# Patient Record
Sex: Female | Born: 1965 | Hispanic: Yes | Marital: Married | Smoking: Never smoker
Health system: Southern US, Community
[De-identification: ages and names within clinical notes are randomized; demographics above are authoritative.]

## PROBLEM LIST (undated history)

## (undated) DIAGNOSIS — Z9889 Other specified postprocedural states: Secondary | ICD-10-CM

## (undated) DIAGNOSIS — D649 Anemia, unspecified: Secondary | ICD-10-CM

## (undated) DIAGNOSIS — T8859XA Other complications of anesthesia, initial encounter: Secondary | ICD-10-CM

## (undated) DIAGNOSIS — T7840XA Allergy, unspecified, initial encounter: Secondary | ICD-10-CM

## (undated) DIAGNOSIS — K8020 Calculus of gallbladder without cholecystitis without obstruction: Secondary | ICD-10-CM

## (undated) DIAGNOSIS — K219 Gastro-esophageal reflux disease without esophagitis: Secondary | ICD-10-CM

## (undated) DIAGNOSIS — J45909 Unspecified asthma, uncomplicated: Secondary | ICD-10-CM

## (undated) HISTORY — PX: OTHER SURGICAL HISTORY: SHX169

## (undated) HISTORY — DX: Allergy, unspecified, initial encounter: T78.40XA

## (undated) HISTORY — PX: CARPAL TUNNEL RELEASE: SHX101

## (undated) HISTORY — PX: DIAGNOSTIC LAPAROSCOPY: SUR761

---

## 2003-12-17 ENCOUNTER — Ambulatory Visit: Payer: Self-pay | Admitting: Obstetrics and Gynecology

## 2004-01-01 ENCOUNTER — Ambulatory Visit: Payer: Self-pay | Admitting: Obstetrics and Gynecology

## 2004-07-11 ENCOUNTER — Ambulatory Visit: Payer: Self-pay | Admitting: Obstetrics and Gynecology

## 2004-12-05 ENCOUNTER — Ambulatory Visit: Payer: Self-pay | Admitting: Obstetrics and Gynecology

## 2004-12-15 ENCOUNTER — Ambulatory Visit: Payer: Self-pay | Admitting: Obstetrics and Gynecology

## 2005-07-17 ENCOUNTER — Ambulatory Visit: Payer: Self-pay | Admitting: General Surgery

## 2006-02-20 ENCOUNTER — Ambulatory Visit: Payer: Self-pay | Admitting: Obstetrics and Gynecology

## 2006-03-22 ENCOUNTER — Emergency Department: Payer: Self-pay | Admitting: General Practice

## 2006-03-29 ENCOUNTER — Ambulatory Visit: Payer: Self-pay | Admitting: Obstetrics and Gynecology

## 2006-03-30 ENCOUNTER — Ambulatory Visit: Payer: Self-pay | Admitting: Obstetrics and Gynecology

## 2006-04-01 ENCOUNTER — Other Ambulatory Visit: Payer: Self-pay

## 2006-04-02 ENCOUNTER — Observation Stay: Payer: Self-pay | Admitting: Obstetrics and Gynecology

## 2007-09-08 ENCOUNTER — Other Ambulatory Visit: Payer: Self-pay

## 2007-09-08 ENCOUNTER — Emergency Department: Payer: Self-pay | Admitting: Emergency Medicine

## 2007-09-21 ENCOUNTER — Other Ambulatory Visit: Payer: Self-pay

## 2007-09-21 ENCOUNTER — Emergency Department: Payer: Self-pay | Admitting: Emergency Medicine

## 2007-10-30 ENCOUNTER — Ambulatory Visit: Payer: Self-pay

## 2008-11-04 ENCOUNTER — Ambulatory Visit: Payer: Self-pay | Admitting: Family Medicine

## 2009-12-29 ENCOUNTER — Ambulatory Visit: Payer: Self-pay | Admitting: Family Medicine

## 2011-04-19 ENCOUNTER — Ambulatory Visit: Payer: Self-pay

## 2012-11-25 ENCOUNTER — Ambulatory Visit: Payer: Self-pay

## 2014-04-29 ENCOUNTER — Ambulatory Visit: Payer: Self-pay

## 2015-03-17 ENCOUNTER — Other Ambulatory Visit: Payer: Self-pay | Admitting: Unknown Physician Specialty

## 2015-03-17 DIAGNOSIS — G8929 Other chronic pain: Secondary | ICD-10-CM

## 2015-03-17 DIAGNOSIS — R1011 Right upper quadrant pain: Secondary | ICD-10-CM

## 2015-03-17 DIAGNOSIS — R1013 Epigastric pain: Principal | ICD-10-CM

## 2015-03-19 ENCOUNTER — Ambulatory Visit
Admission: RE | Admit: 2015-03-19 | Discharge: 2015-03-19 | Disposition: A | Discharge status: Home / Self Care | Payer: BLUE CROSS/BLUE SHIELD | Source: Ambulatory Visit | Attending: Unknown Physician Specialty | Admitting: Unknown Physician Specialty

## 2015-03-19 DIAGNOSIS — G8929 Other chronic pain: Secondary | ICD-10-CM | POA: Diagnosis present

## 2015-03-19 DIAGNOSIS — R1011 Right upper quadrant pain: Secondary | ICD-10-CM

## 2015-03-19 DIAGNOSIS — K8020 Calculus of gallbladder without cholecystitis without obstruction: Secondary | ICD-10-CM | POA: Insufficient documentation

## 2015-03-19 DIAGNOSIS — R1013 Epigastric pain: Secondary | ICD-10-CM

## 2015-03-24 ENCOUNTER — Other Ambulatory Visit: Payer: Self-pay | Admitting: Surgery

## 2015-03-24 DIAGNOSIS — K8020 Calculus of gallbladder without cholecystitis without obstruction: Secondary | ICD-10-CM

## 2015-03-26 ENCOUNTER — Ambulatory Visit
Admission: RE | Admit: 2015-03-26 | Discharge: 2015-03-26 | Disposition: A | Discharge status: Home / Self Care | Payer: BLUE CROSS/BLUE SHIELD | Source: Ambulatory Visit | Attending: Surgery | Admitting: Surgery

## 2015-03-26 DIAGNOSIS — K8010 Calculus of gallbladder with chronic cholecystitis without obstruction: Secondary | ICD-10-CM | POA: Diagnosis present

## 2015-03-26 DIAGNOSIS — K824 Cholesterolosis of gallbladder: Secondary | ICD-10-CM | POA: Diagnosis not present

## 2015-03-26 DIAGNOSIS — K8020 Calculus of gallbladder without cholecystitis without obstruction: Secondary | ICD-10-CM

## 2015-03-28 ENCOUNTER — Encounter: Payer: Self-pay | Admitting: Emergency Medicine

## 2015-03-28 DIAGNOSIS — N938 Other specified abnormal uterine and vaginal bleeding: Secondary | ICD-10-CM | POA: Insufficient documentation

## 2015-03-28 DIAGNOSIS — Z3202 Encounter for pregnancy test, result negative: Secondary | ICD-10-CM | POA: Diagnosis not present

## 2015-03-28 DIAGNOSIS — N939 Abnormal uterine and vaginal bleeding, unspecified: Secondary | ICD-10-CM | POA: Diagnosis present

## 2015-03-28 DIAGNOSIS — R531 Weakness: Secondary | ICD-10-CM | POA: Insufficient documentation

## 2015-03-28 LAB — POCT PREGNANCY, URINE: PREG TEST UR: NEGATIVE

## 2015-03-28 NOTE — ED Notes (Signed)
Vaginal bleeding since yesterday - on birth control and it is not time. Believes may be because of her newly dx gallstones.

## 2015-03-28 NOTE — ED Notes (Signed)
On birth control and started bleeding yesterday. Is worried that it is because she has gall stones. Also states worried she will get dizzy

## 2015-03-29 ENCOUNTER — Emergency Department
Admission: EM | Admit: 2015-03-29 | Discharge: 2015-03-29 | Disposition: A | Discharge status: Home / Self Care | Payer: BLUE CROSS/BLUE SHIELD | Attending: Emergency Medicine | Admitting: Emergency Medicine

## 2015-03-29 ENCOUNTER — Emergency Department: Payer: BLUE CROSS/BLUE SHIELD

## 2015-03-29 DIAGNOSIS — N938 Other specified abnormal uterine and vaginal bleeding: Secondary | ICD-10-CM

## 2015-03-29 DIAGNOSIS — N939 Abnormal uterine and vaginal bleeding, unspecified: Secondary | ICD-10-CM

## 2015-03-29 HISTORY — DX: Calculus of gallbladder without cholecystitis without obstruction: K80.20

## 2015-03-29 LAB — COMPREHENSIVE METABOLIC PANEL
ALT: 83 U/L — ABNORMAL HIGH (ref 14–54)
ANION GAP: 10 (ref 5–15)
AST: 27 U/L (ref 15–41)
Albumin: 4.8 g/dL (ref 3.5–5.0)
Alkaline Phosphatase: 112 U/L (ref 38–126)
BILIRUBIN TOTAL: 0.9 mg/dL (ref 0.3–1.2)
BUN: 8 mg/dL (ref 6–20)
CO2: 26 mmol/L (ref 22–32)
Calcium: 9.8 mg/dL (ref 8.9–10.3)
Chloride: 104 mmol/L (ref 101–111)
Creatinine, Ser: 0.67 mg/dL (ref 0.44–1.00)
Glucose, Bld: 97 mg/dL (ref 65–99)
POTASSIUM: 3.4 mmol/L — AB (ref 3.5–5.1)
Sodium: 140 mmol/L (ref 135–145)
TOTAL PROTEIN: 8.3 g/dL — AB (ref 6.5–8.1)

## 2015-03-29 LAB — URINALYSIS COMPLETE WITH MICROSCOPIC (ARMC ONLY)
BILIRUBIN URINE: NEGATIVE
GLUCOSE, UA: NEGATIVE mg/dL
Leukocytes, UA: NEGATIVE
NITRITE: NEGATIVE
Protein, ur: NEGATIVE mg/dL
Specific Gravity, Urine: 1.004 — ABNORMAL LOW (ref 1.005–1.030)
pH: 7 (ref 5.0–8.0)

## 2015-03-29 LAB — CBC
HEMATOCRIT: 36.9 % (ref 35.0–47.0)
HEMOGLOBIN: 12.5 g/dL (ref 12.0–16.0)
MCH: 30.3 pg (ref 26.0–34.0)
MCHC: 33.9 g/dL (ref 32.0–36.0)
MCV: 89.5 fL (ref 80.0–100.0)
Platelets: 193 10*3/uL (ref 150–440)
RBC: 4.12 MIL/uL (ref 3.80–5.20)
RDW: 13.3 % (ref 11.5–14.5)
WBC: 6.7 10*3/uL (ref 3.6–11.0)

## 2015-03-29 LAB — CHLAMYDIA/NGC RT PCR (ARMC ONLY)
CHLAMYDIA TR: NOT DETECTED
N gonorrhoeae: NOT DETECTED

## 2015-03-29 LAB — LIPASE, BLOOD: LIPASE: 28 U/L (ref 11–51)

## 2015-03-29 LAB — WET PREP, GENITAL
CLUE CELLS WET PREP: NONE SEEN
Sperm: NONE SEEN
Trich, Wet Prep: NONE SEEN
Yeast Wet Prep HPF POC: NONE SEEN

## 2015-03-29 MED ORDER — SODIUM CHLORIDE 0.9 % IV BOLUS (SEPSIS)
1000.0000 mL | Freq: Once | INTRAVENOUS | Status: AC
  Administered 2015-03-29: 1000 mL via INTRAVENOUS

## 2015-03-29 NOTE — ED Provider Notes (Signed)
[facility] Emergency Department Provider Note  ____________________________________________  Time seen: Approximately 131 AM  I have reviewed the triage vital signs and the nursing notes.   HISTORY  Chief Complaint Vaginal Bleeding    HPI Monique Sanders is a 50 y.o. female who comes into the hospital today with bleeding she reports either in her urine or her vagina and feeling dizzy. The patient reports that she was here 1 week ago and had an ultrasound of her gallbladder as well as another test. She reports that she was found to have gallstones. She reports that she is been getting weak and pale from the pain. She was told that she needed surgery and she is here because she wants surgery done. The patient reports that she's having pressure in her upper abdomen and not pain. She reports that she has some flank pain. She has not been eating normal and she has been told to go on a bland diet. The patient is also feeling weak. The patient's family reports that they were scared because the bleeding started and they didn't know if it was due to the gallstones. The patient is on birth control pills and reports that she is not yet due to have her menstrual cycle. The patient's family wants her to have both problems taking care of because of her symptoms. The family reports the patient did have some black stool a few days ago after drinking some lemon juice and in attempt to dissolve her gallstones. She has not had black stool since then.The patient and her family are concerned so they decided to come in for evaluation.   Past Medical History  Diagnosis Date  . Gallstones     There are no active problems to display for this patient.   No past surgical history on file.  No current outpatient prescriptions on file.  Allergies Codeine; Pamabrom; Pamprin; and Tylenol  No family history on file.  Social History Social History  Substance Use Topics  . Smoking  status: Not on file  . Smokeless tobacco: Not on file  . Alcohol Use: Not on file    Review of Systems Constitutional: No fever/chills Eyes: No visual changes. ENT: No sore throat. Cardiovascular: Denies chest pain. Respiratory: Denies shortness of breath. Gastrointestinal: Abdominal pain Genitourinary: Vaginal bleeding Musculoskeletal: Right flank pain Skin: Negative for rash. Neurological: Generalized weakness  10-point ROS otherwise negative.  ____________________________________________   PHYSICAL EXAM:  VITAL SIGNS: ED Triage Vitals  Enc Vitals Group     BP 03/28/15 2120 141/73 mmHg     Pulse Rate 03/28/15 2120 93     Resp 03/28/15 2120 18     Temp 03/28/15 2120 97.9 F (36.6 C)     Temp Source 03/28/15 2120 Oral     SpO2 03/28/15 2120 98 %     Weight 03/28/15 2120 130 lb (58.968 kg)     Height 03/28/15 2120  (1.575 m)     Head Cir --      Peak Flow --      Pain Score 03/29/15 0208 0     Pain Loc --      Pain Edu? --      Excl. in GC? --     Constitutional: Alert and oriented. Well appearing and in mild distress. Eyes: Conjunctivae are normal. PERRL. EOMI. Head: Atraumatic. Nose: No congestion/rhinnorhea. Mouth/Throat: Mucous membranes are moist.  Oropharynx non-erythematous. Cardiovascular: Normal rate, regular rhythm. Grossly normal heart sounds.  Good peripheral circulation. Respiratory:  Normal respiratory effort.  No retractions. Lungs CTAB. Gastrointestinal: Soft and nontender. No distention. Positive bowel sounds Genitourinary: Normal external genitalia with some moderate blood in the vaginal vault. Cervix unremarkable with right adnexal tenderness to palpation Musculoskeletal: No lower extremity tenderness nor edema.   Neurologic:  Normal speech and language.  Skin:  Skin is warm, dry and intact.  Psychiatric: Mood and affect are normal.   ____________________________________________   LABS (all labs ordered are listed, but only abnormal  results are displayed)  Labs Reviewed  WET PREP, GENITAL - Abnormal; Notable for the following:    WBC, Wet Prep HPF POC MODERATE (*)    All other components within normal limits  URINALYSIS COMPLETEWITH MICROSCOPIC ([facility] ONLY) - Abnormal; Notable for the following:    Color, Urine YELLOW (*)    APPearance CLEAR (*)    Ketones, ur TRACE (*)    Specific Gravity, Urine 1.004 (*)    Hgb urine dipstick 3+ (*)    Bacteria, UA RARE (*)    Squamous Epithelial / LPF 0-5 (*)    All other components within normal limits  COMPREHENSIVE METABOLIC PANEL - Abnormal; Notable for the following:    Potassium 3.4 (*)    Total Protein 8.3 (*)    ALT 83 (*)    All other components within normal limits  CHLAMYDIA/NGC RT PCR ([facility] ONLY)  CBC  LIPASE, BLOOD  POC URINE PREG, ED  POCT PREGNANCY, URINE   ____________________________________________  EKG  None ____________________________________________  RADIOLOGY  US pelvis: Normal appearance of the uterus and ovaries, Normal endometrial stripe thickness ____________________________________________   PROCEDURES  Procedure(s) performed: None  Critical Care performed: No  ____________________________________________   INITIAL IMPRESSION / ASSESSMENT AND PLAN / ED COURSE  Pertinent labs & imaging results that were available during my care of the patient were reviewed by me and considered in my medical decision making (see chart for details).  This is a 50 year old female who comes in today with vaginal bleeding as well as feeling dizzy. The patient does have gallstones and is concerned this may be due to her gallstones. I will send the patient for an ultrasound of her pelvis looking for causes of her bleeding and I will check some blood work as well.  The patient's ultrasound is unremarkable as well as blood work. The patient does not have any anemia at this time nor is she having any pain. Although the patient's husband would  like for [place] to contact surgery to take her gallbladder the patient does not have an emergent need to have her gallbladder removed as she is not having any pain. The patient will be discharged home to follow-up with surgery as well as with OB/GYN. As the patient is taking birth control pills I will hold off on dosing Provera and have her follow-up. ____________________________________________   FINAL CLINICAL IMPRESSION(S) / ED DIAGNOSES  Final diagnoses:  Vaginal bleeding  Dysfunctional uterine bleeding      Rebecka Apley, MD 03/29/15 [phone]

## 2015-03-29 NOTE — Discharge Instructions (Signed)
Metrorragia funcional (Dysfunctional Uterine Bleeding) La metrorragia funcional es una hemorragia anormal proveniente del tero. La metrorragia funcional incluye estos sntomas:  Menstruacin que se adelanta o se atrasa.  Menstruacin menos o ms abundante, o con cogulos sanguneos.  Hemorragias entre los perodos menstruales.  Ausencia de una o ms menstruaciones.  Hemorragias luego de mantener relaciones sexuales.  Sangrado luego de la menopausia. INSTRUCCIONES PARA EL CUIDADO EN EL HOGAR  Est atenta a cualquier cambio en los sntomas. Estas indicaciones pueden ayudarla con el trastorno: Comidas  Siga una dieta equilibrada. Incluya alimentos con alto contenido de hierro, como hgado, carne, mariscos, verduras de hoja verde y huevos.  Si tiene estreimiento:  Beba abundante agua.  Consuma frutas y verduras con alto contenido de agua y fibra, como espinaca, zanahorias, frambuesas, manzanas y mango. Medicamentos  Tome los medicamentos de venta libre y los recetados solamente como se lo haya indicado el mdico.  No haga cambios en los medicamentos sin hablar con el mdico.  La aspirina o los medicamentos que la contienen pueden aumentar la hemorragia. No tome esos medicamentos:  Durante la semana previa a la menstruacin.  Durante la menstruacin.  Si le recetaron comprimidos de hierro, tmelos como se lo haya indicado el mdico. Estos ayudan a reponer el hierro que el organismo pierde debido a este trastorno. Actividad  Si debe cambiarse el apsito o el tampn ms de una vez cada 2horas:  Acustese con los pies elevados.  Colquese una compresa fra en la parte baja del abdomen.  Haga todo el reposo que pueda hasta que la hemorragia se detenga o disminuya.  No trate de adelgazar hasta que la hemorragia se detenga y los niveles de hierro en la sangre se normalicen. Otras indicaciones  Durante dos meses, anote lo siguiente:  La fecha de comienzo de la  menstruacin.  La fecha de su finalizacin.  Los momentos en los que tiene una hemorragia anormal.  Los problemas que advierte.  Concurra a todas las visitas de control como se lo haya indicado el mdico. Esto es importante. SOLICITE ATENCIN MDICA SI:  Se siente dbil o que va a desvanecerse.  Tiene nuseas y vmitos.  No puede comer ni beber sin vomitar.  Tiene mareos o diarrea mientras toma los medicamentos.  Est tomando anticonceptivos u hormonas, y desea cambiar o suspender estos medicamentos. SOLICITE ATENCIN MDICA DE INMEDIATO SI:  Tiene escalofros o fiebre.  Debe cambiarse el apsito o el tampn ms de una vez por hora.  La hemorragia se vuelve ms abundante o el flujo menstrual contiene cogulos con ms frecuencia.  Siente dolor en el abdomen.  Pierde la conciencia.  Le aparece una erupcin cutnea.   Esta informacin no tiene como fin reemplazar el consejo del mdico. Asegrese de hacerle al mdico cualquier pregunta que tenga.   Document Released: 11/09/2004 Document Revised: 10/21/2014 Elsevier Interactive Patient Education 2016 [employer] Inc.  

## 2015-03-30 ENCOUNTER — Inpatient Hospital Stay: Admission: RE | Admit: 2015-03-30 | Payer: BLUE CROSS/BLUE SHIELD | Source: Ambulatory Visit

## 2015-03-31 ENCOUNTER — Encounter: Payer: Self-pay | Admitting: *Deleted

## 2015-03-31 NOTE — Patient Instructions (Addendum)
  Your procedure is scheduled on: 04/06/15 Report to Day Surgery. [facility] SECOND FLOOR To find out your arrival time please call [phone] between 1PM - 3PM on 04/05/15  Remember: Instructions that are not followed completely may result in serious medical risk, up to and including death, or upon the discretion of your surgeon and anesthesiologist your surgery may need to be rescheduled.    _X___ 1. Do not eat food or drink liquids after midnight. No gum chewing or hard candies.     _X___ 2. No Alcohol for 24 hours before or after surgery.   ____ 3. Bring all medications with you on the day of surgery if instructed.    _X___ 4. Notify your doctor if there is any change in your medical condition     (cold, fever, infections).     Do not wear jewelry, make-up, hairpins, clips or nail polish.  Do not wear lotions, powders, or perfumes. You may wear deodorant.  Do not shave 48 hours prior to surgery. Men may shave face and neck.  Do not bring valuables to the hospital.    [facility] is not responsible for any belongings or valuables.               Contacts, dentures or bridgework may not be worn into surgery.  Leave your suitcase in the car. After surgery it may be brought to your room.  For patients admitted to the hospital, discharge time is determined by your                treatment team.   Patients discharged the day of surgery will not be allowed to drive home.   Please read over the following fact sheets that you were given:   Surgical Site Infection Prevention   ____ Take these medicines the morning of surgery with A SIP OF WATER:    1. NONE  2.   3.   4.  5.  6.  ____ Fleet Enema (as directed)   __X__ Use CHG Soap as directed  _X___ Use inhalers on the day of surgery  ____ Stop metformin 2 days prior to surgery    ____ Take 1/2 of usual insulin dose the night before surgery and none on the morning of surgery.   ____ Stop Coumadin/Plavix/aspirin on    __X__ Stop Anti-inflammatories on   NO MORE IBUPROFEN UNTIL AFTER SURGERY   ____ Stop supplements until after surgery.    ____ Bring C-Pap to the hospital.

## 2015-04-06 ENCOUNTER — Ambulatory Visit: Admission: RE | Admit: 2015-04-06 | Payer: BLUE CROSS/BLUE SHIELD | Source: Ambulatory Visit | Admitting: Surgery

## 2015-04-06 HISTORY — DX: Gastro-esophageal reflux disease without esophagitis: K21.9

## 2015-04-06 SURGERY — LAPAROSCOPIC CHOLECYSTECTOMY WITH INTRAOPERATIVE CHOLANGIOGRAM
Anesthesia: Choice

## 2015-04-08 ENCOUNTER — Encounter: Payer: Self-pay | Admitting: Gastroenterology

## 2015-04-08 ENCOUNTER — Ambulatory Visit (INDEPENDENT_AMBULATORY_CARE_PROVIDER_SITE_OTHER): Payer: BLUE CROSS/BLUE SHIELD | Admitting: Gastroenterology

## 2015-04-08 ENCOUNTER — Other Ambulatory Visit: Payer: Self-pay

## 2015-04-08 VITALS — BP 118/70 | HR 62 | Temp 98.3°F | Ht 62.0 in | Wt 126.0 lb

## 2015-04-08 DIAGNOSIS — R1011 Right upper quadrant pain: Principal | ICD-10-CM

## 2015-04-08 DIAGNOSIS — G8929 Other chronic pain: Secondary | ICD-10-CM

## 2015-04-08 DIAGNOSIS — R634 Abnormal weight loss: Secondary | ICD-10-CM

## 2015-04-08 DIAGNOSIS — R1010 Upper abdominal pain, unspecified: Secondary | ICD-10-CM

## 2015-04-08 NOTE — Patient Instructions (Signed)
HIDA Scan appt is scheduled at [facility] location on Friday, March 3rd @ 10:00am. You are to arrive at 9:45am.

## 2015-04-08 NOTE — Progress Notes (Signed)
Gastroenterology Consultation  Referring Provider:     [facility] Primary Care Physician:  [facility] Primary Gastroenterologist:  Dr. Servando Snare     Reason for Consultation:     Abdominal pain        HPI:   Monique Sanders is a 50 y.o. y/o female referred for consultation & management of  Abdominal pain by Dr. Lorin [facility].   This patient comes in today with a report of abdominal pain on the right side. She also reports that she has lost weight because she is not eating well. The patient had to ultrasound that showed stones in her gallbladder but then she reports she took a home remedy and he stones disappeared on subsequent ultrasound. No subsequent ultrasound are not on the computer and I do not have access to them at this time. She was supposed to have her gallbladder removed but when he stones were no longer seen she decided to cancel the surgery. The patient states that her symptoms are worse with having daddy and greasy foods. She also reports that the pain causes her to have bloating of her abdomen. The weight loss has been on  Unintentional and is mostly because the patient has pain when eating. There is no report of any nausea or vomiting. She also denies any black stools or bloody stools.  Past Medical History  Diagnosis Date  . Gallstones   . GERD (gastroesophageal reflux disease)     Past Surgical History  Procedure Laterality Date  . Ctr    . Diagnostic laparoscopy    . Ovarian tube  Left     Prior to Admission medications   Medication Sig Start Date End Date Taking? Authorizing Provider  albuterol (PROVENTIL HFA;VENTOLIN HFA) 108 (90 Base) MCG/ACT inhaler Inhale 2 puffs into the lungs.   Yes Historical Provider, MD  cetirizine (ZYRTEC) 10 MG tablet Take 10 mg by mouth daily.   Yes Historical Provider, MD  ibuprofen (ADVIL,MOTRIN) 200 MG tablet Take 200 mg by mouth.   Yes Historical Provider, MD  Norgestimate-Ethinyl  Estradiol Triphasic (ORTHO TRI-CYCLEN LO) 0.18/0.215/0.25 MG-25 MCG tab Take 1 tablet by mouth daily.   Yes Historical Provider, MD  omeprazole (PRILOSEC) 20 MG capsule Take 20 mg by mouth daily.   Yes Historical Provider, MD  sucralfate (CARAFATE) 1 g tablet Reported on 04/08/2015 03/17/15   Historical Provider, MD    Family History  Problem Relation Age of Onset  . Alcohol abuse Father      Social History  Substance Use Topics  . Smoking status: Never Smoker   . Smokeless tobacco: Never Used  . Alcohol Use: No    Allergies as of 04/08/2015 - Review Complete 04/08/2015  Allergen Reaction Noted  . Codeine  03/28/2015  . Pamabrom  03/28/2015  . Pamprin [apap-pamabrom-pyrilamine]  03/28/2015  . Tylenol [acetaminophen]  03/28/2015    Review of Systems:    All systems reviewed and negative except where noted in HPI.   Physical Exam:  BP 118/70 mmHg  Pulse 62  Temp(Src) 98.3 F (36.8 C) (Oral)  Ht  (1.575 m)  Wt 126 lb (57.153 kg)  BMI 23.04 kg/m2  LMP 03/27/2015 Patient's last menstrual period was 03/27/2015. Psych:  Alert and cooperative. Normal mood and affect. General:   Alert,  Well-developed, well-nourished, pleasant and cooperative in NAD Head:  Normocephalic and atraumatic. Eyes:  Sclera clear, no icterus.   Conjunctiva pink. Ears:  Normal auditory acuity. Nose:  No deformity, discharge, or lesions. Mouth:  No deformity or lesions,oropharynx pink & moist. Neck:  Supple; no masses or thyromegaly. Lungs:  Respirations even and unlabored.  Clear throughout to auscultation.   No wheezes, crackles, or rhonchi. No acute distress. Heart:  Regular rate and rhythm; no murmurs, clicks, rubs, or gallops. Abdomen:  Normal bowel sounds.  No bruits.  Soft with tenderness to one finger palpation while raising the leg 6 inches above the exam table. There was no hepatosplenomegaly or hernias noted.  No guarding or rebound tenderness.  Positive Carnett sign.   Rectal:  Deferred.    Msk:  Symmetrical without gross deformities.  Good, equal movement & strength bilaterally. Pulses:  Normal pulses noted. Extremities:  No clubbing or edema.  No cyanosis. Neurologic:  Alert and oriented x3;  grossly normal neurologically. Skin:  Intact without significant lesions or rashes.  No jaundice. Lymph Nodes:  No significant cervical adenopathy. Psych:  Alert and cooperative. Normal mood and affect.  Imaging Studies: US Transvaginal Non-ob  April 15, 2015  CLINICAL DATA:  Heavy menses.  Second period this month. EXAM: TRANSABDOMINAL AND TRANSVAGINAL ULTRASOUND OF PELVIS TECHNIQUE: Both transabdominal and transvaginal ultrasound examinations of the pelvis were performed. Transabdominal technique was performed for global imaging of the pelvis including uterus, ovaries, adnexal regions, and pelvic cul-de-sac. It was necessary to proceed with endovaginal exam following the transabdominal exam to visualize the ovaries and endometrium. COMPARISON:  CT abdomen and pelvis 04/02/2006. Ultrasound pelvis 03/22/2006. FINDINGS: Uterus Measurements: 6.1 x 4.2 x 4.4 cm. Uterus is retroverted. No fibroids or other mass visualized. Endometrium Thickness: 3.3 mm.  No focal abnormality visualized. Right ovary Measurements: 3 x 1.1 x 1.1 cm. Normal appearance/no adnexal mass. Left ovary Measurements: 2.8 x 0.9 x 1 cm. Normal appearance/no adnexal mass. Other findings Minimal free fluid in the pelvis. IMPRESSION: Normal appearance of the uterus and ovaries. Normal endometrial stripe thickness. Electronically Signed   By: Burman Nieves M.D.   On: April 15, 2015 04:57   US Pelvis Complete  Apr 15, 2015  CLINICAL DATA:  Heavy menses.  Second period this month. EXAM: TRANSABDOMINAL AND TRANSVAGINAL ULTRASOUND OF PELVIS TECHNIQUE: Both transabdominal and transvaginal ultrasound examinations of the pelvis were performed. Transabdominal technique was performed for global imaging of the pelvis including uterus, ovaries, adnexal  regions, and pelvic cul-de-sac. It was necessary to proceed with endovaginal exam following the transabdominal exam to visualize the ovaries and endometrium. COMPARISON:  CT abdomen and pelvis 04/02/2006. Ultrasound pelvis 03/22/2006. FINDINGS: Uterus Measurements: 6.1 x 4.2 x 4.4 cm. Uterus is retroverted. No fibroids or other mass visualized. Endometrium Thickness: 3.3 mm.  No focal abnormality visualized. Right ovary Measurements: 3 x 1.1 x 1.1 cm. Normal appearance/no adnexal mass. Left ovary Measurements: 2.8 x 0.9 x 1 cm. Normal appearance/no adnexal mass. Other findings Minimal free fluid in the pelvis. IMPRESSION: Normal appearance of the uterus and ovaries. Normal endometrial stripe thickness. Electronically Signed   By: Burman Nieves M.D.   On: 2015/04/15 04:57   Dg Kayleen Memos  W/kub  03/19/2015  CLINICAL DATA:  Pain. EXAM: UPPER GI SERIES WITH KUB TECHNIQUE: After obtaining a scout radiograph a routine upper GI series was performed using thin and high density barium. FLUOROSCOPY TIME:  Radiation Exposure Index (as provided by the fluoroscopic device): 24.1 mGy COMPARISON:  None. FINDINGS: Esophagus and stomach are normal. Mild duodenum bulb fold thickening. Mild duodenitis cannot be excluded. C-loop normal. No gastroesophageal reflux. IMPRESSION: Mild duodenum fold thickening. Duodenitis cannot be excluded. Exam otherwise negative. Electronically  Signed   By: Maisie Fus  Register   On: 03/19/2015 10:43   US Abdomen Limited Ruq  03/26/2015  CLINICAL DATA:  History of gallstones; follow-up study EXAM: US ABDOMEN LIMITED - RIGHT UPPER QUADRANT COMPARISON:  Limited abdominal ultrasound of March 19, 2015 FINDINGS: Gallbladder: The gallbladder is adequately distended. Radiodense bile-sludge remains visible. Tiny echogenic foci a previously demonstrated near the gallbladder neck are not clearly evident today. Mural echogenicity persists likely reflecting cholesterolosis. The gallbladder wall measures 2.8 mm.  There is no pericholecystic fluid or positive sonographic Murphy's sign. Common bile duct: Diameter: 4.6 mm Liver: The liver exhibits normal echotexture with no focal mass nor ductal dilation. IMPRESSION: Persistent echogenic bile or sludge. Mural cholesterolosis. No discrete stones. No sonographic evidence of acute cholecystitis. Electronically Signed   By: David  [place] M.D.   On: 03/26/2015 09:36   US Abdomen Limited Ruq  03/19/2015  CLINICAL DATA:  Chronic epigastric and right upper quadrant discomfort. EXAM: US ABDOMEN LIMITED - RIGHT UPPER QUADRANT COMPARISON:  Abdominal pelvic CT scan dated April 02, 2006. FINDINGS: Gallbladder: The gallbladder is adequately distended. There is echogenic bile or sludge as well as a few small stones near the gallbladder neck. Probable cholesterolosis of the gallbladder wall is demonstrated with twinkle artifact demonstrated. There is no gallbladder wall thickening, pericholecystic fluid, or positive sonographic Murphy's sign. Common bile duct: Diameter: 2.5 mm Liver: The liver exhibits normal echotexture with no focal mass nor ductal dilation. The surface contour of the liver appears normal. IMPRESSION: 1. Gallstones, sludge, and findings suggesting cholesterolosis. This may indicate underlying chronic cholecystitis. 2. No acute gallbladder or common bile duct pathology. Electronically Signed   By: David  [place] M.D.   On: 03/19/2015 09:45    Assessment and Plan:   Monique Divirgilio is a 50 y.o. y/o female  Who comes in today with signs of clear musculoskeletal pain with reproductionof the pain with muscle flexion. The patient also has lost weight and has intolerance to fatty foods. The patient likely has two processes going on at this time. There is obvious musculoskeletal pain and possible gallbladder dysfunction. The patient will be set up for a HIDA scan with CCK. The patient will also continue her at [facility] with she has been taking for the abdominal pain. The  patient has been explained the plan and that and will be contacted with the results of the scan.   Note: This dictation was prepared with Dragon dictation along with smaller phrase technology. Any transcriptional errors that result from this process are unintentional.

## 2015-04-16 ENCOUNTER — Ambulatory Visit
Admission: RE | Admit: 2015-04-16 | Discharge: 2015-04-16 | Disposition: A | Discharge status: Home / Self Care | Payer: BLUE CROSS/BLUE SHIELD | Source: Ambulatory Visit | Attending: Gastroenterology | Admitting: Gastroenterology

## 2015-04-16 DIAGNOSIS — R1011 Right upper quadrant pain: Secondary | ICD-10-CM | POA: Diagnosis not present

## 2015-04-16 MED ORDER — SINCALIDE 5 MCG IJ SOLR
0.0200 ug/kg | Freq: Once | INTRAMUSCULAR | Status: AC
  Administered 2015-04-16: 1.14 ug via INTRAVENOUS

## 2015-04-16 MED ORDER — TECHNETIUM TC 99M MEBROFENIN IV KIT
5.0000 | PACK | Freq: Once | INTRAVENOUS | Status: AC | PRN
  Administered 2015-04-16: 5.13 via INTRAVENOUS

## 2015-04-21 ENCOUNTER — Telehealth: Payer: Self-pay

## 2015-04-21 NOTE — Telephone Encounter (Signed)
Pt's husband has been of results. Pt has been scheduled for a consultation with Dr. Orvis BrillLoflin on Friday, March 17th at 4:00pm.

## 2015-04-21 NOTE — Telephone Encounter (Signed)
-----   Message from Midge Miniumarren Wohl, MD sent at 04/20/2015  2:12 PM EST ----- Let the patient know that her gallbladder emptying study showed her gallbladder to empty well. Because she did have the pain when she received CCK she should be evaluated by surgery.

## 2015-04-30 ENCOUNTER — Ambulatory Visit (INDEPENDENT_AMBULATORY_CARE_PROVIDER_SITE_OTHER): Payer: BLUE CROSS/BLUE SHIELD | Admitting: Surgery

## 2015-04-30 ENCOUNTER — Encounter: Payer: Self-pay | Admitting: Surgery

## 2015-04-30 ENCOUNTER — Other Ambulatory Visit: Payer: Self-pay

## 2015-04-30 VITALS — BP 159/99 | HR 82 | Temp 98.5°F | Ht 62.0 in | Wt 124.4 lb

## 2015-04-30 DIAGNOSIS — K8020 Calculus of gallbladder without cholecystitis without obstruction: Secondary | ICD-10-CM

## 2015-04-30 NOTE — Telephone Encounter (Signed)
Opened by mistake.

## 2015-04-30 NOTE — Patient Instructions (Signed)
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 Su operacion va a 05/06/2015 en [facility] con el Dr. Everlene FarrierPabon.  Probablemente va a Bear Stearnsestar dos semanas fuera del [place]. Usted va a tener restriccion de no levantar nada pesado mas de 15 lbs en esas dos semanas despues de su operacion.  Usted va a poder comer lo que usted quiera despues de su operacion. Empieze a comer comidas blandas al principio y luego puede continuar con lo que [place].  Por favor vea su pagina azul para ver sus instrucciones.

## 2015-04-30 NOTE — Progress Notes (Signed)
Subjective:     Patient ID: Monique Sanders, female   DOB: [birth year], 50 y.o.   MRN: [id number]  HPI  50 y.o. female which is Spanish-speaking I examined and spoke with her with the interpreter as well as her husband. Patient comes to me today with complaint of intermittent abdominal pain in the right upper quadrant and moves around her side and through to her back. Patient first started noticing this in February of this year. Patient was seen in the emergency room and had ultrasound that showed some gallstones at that time. Patient was seen by Dr. Renda Rolls who recommended a cholecystectomy however the patient did not want to have her gallbladder taken out at that time. Patient tried a home remedy of all of all and limiting juice to try and get rid of the stones and they did have a second ultrasound at the patient's request that showed resolution of the stones per the patient the read actually does still state that there is some radiodense bile sludge that remains visible with some teeny echogenic foci that are near the gallbladder neck but are not clearly evident as they were on the first ultrasound. Patient was then seen by the GI doctor with heavy set her for a HIDA scan. Patient does state that she had reproduction of the pain with the CCK that was administered. Patient does endorse pain currently and states that fatty foods cause increased pain she states her pain right now is in her right upper quadrant is about a 5 out of 10. Patient also endorses some nausea but denies having any vomiting. Patient has had some diarrhea occasionally states it's worse with greasy or spicy foods. Patient also endorses some increased bloating and gas. Patient denies any fever chills any turning yellow or jaundiced.  Past Medical History  Diagnosis Date  . Gallstones   . GERD (gastroesophageal reflux disease)    Past Surgical History  Procedure Laterality Date  . Ctr    . Diagnostic laparoscopy    .  Ovarian tube  Left    Family History  Problem Relation Age of Onset  . Alcohol abuse Father    Social History   Social History  . Marital Status: Married    Spouse Name: N/A  . Number of Children: N/A  . Years of Education: N/A   Social History Main Topics  . Smoking status: Never Smoker   . Smokeless tobacco: Never Used  . Alcohol Use: No  . Drug Use: No  . Sexual Activity: Not Asked   Other Topics Concern  . None   Social History Narrative    Current outpatient prescriptions:  .  albuterol (PROVENTIL HFA;VENTOLIN HFA) 108 (90 Base) MCG/ACT inhaler, Inhale 2 puffs into the lungs., Disp: , Rfl:  .  cetirizine (ZYRTEC) 10 MG tablet, Take 10 mg by mouth daily., Disp: , Rfl:  .  Levonorgestrel-Ethinyl Estradiol (AMETHIA,CAMRESE) 0.1-0.02 & 0.01 MG tablet, Take 1 tablet by mouth daily., Disp: , Rfl: 2 Allergies  Allergen Reactions  . Codeine   . Pamabrom   . Pamprin [Apap-Pamabrom-Pyrilamine]   . Tylenol [Acetaminophen]        Review of Systems  Constitutional: Positive for activity change, appetite change and fatigue. Negative for fever, chills and unexpected weight change.  HENT: Negative for congestion and sore throat.   Respiratory: Negative for apnea, cough, shortness of breath and wheezing.   Cardiovascular: Negative for chest pain, palpitations and leg swelling.  Gastrointestinal: Positive for nausea, abdominal pain,  diarrhea and abdominal distention. Negative for vomiting, constipation and blood in stool.  Genitourinary: Negative for dysuria, urgency and hematuria.  Musculoskeletal: Negative for back pain.  Skin: Negative for color change, pallor, rash and wound.  Neurological: Negative for weakness and light-headedness.  Hematological: Negative for adenopathy. Does not bruise/bleed easily.  Psychiatric/Behavioral: Negative for agitation. The patient is not nervous/anxious.        Filed Vitals:   04/30/15 1640  BP: 159/99  Pulse: 82  Temp: 98.5 F  (36.9 C)    Objective:   Physical Exam  Constitutional: She is oriented to person, place, and time. She appears well-developed and well-nourished. No distress.  HENT:  Head: Normocephalic and atraumatic.  Right Ear: External ear normal.  Left Ear: External ear normal.  Nose: Nose normal.  Mouth/Throat: Oropharynx is clear and moist. No oropharyngeal exudate.  Eyes: Conjunctivae and EOM are normal. Pupils are equal, round, and reactive to light. No scleral icterus.  Neck: Normal range of motion. Neck supple.  Cardiovascular: Normal rate, regular rhythm, normal heart sounds and intact distal pulses.  Exam reveals no gallop and no friction rub.   No murmur heard. Pulmonary/Chest: Effort normal and breath sounds normal. No respiratory distress. She has no wheezes. She has no rales. She exhibits no tenderness.  Abdominal: Soft. Bowel sounds are normal. She exhibits no distension. There is tenderness. There is no rebound and no guarding.  Epigastric and RUQ tenderness  Musculoskeletal: Normal range of motion. She exhibits no edema or tenderness.  Neurological: She is alert and oriented to person, place, and time. No cranial nerve deficit.  Skin: Skin is warm and dry. No rash noted. No erythema. No pallor.  Psychiatric: She has a normal mood and affect. Her behavior is normal. Judgment and thought content normal.  Vitals reviewed.      CBC    Component Value Date/Time   WBC 6.7 03/29/2015 0214   RBC 4.12 03/29/2015 0214   HGB 12.5 03/29/2015 0214   HCT 36.9 03/29/2015 0214   PLT 193 03/29/2015 0214   MCV 89.5 03/29/2015 0214   MCH 30.3 03/29/2015 0214   MCHC 33.9 03/29/2015 0214   RDW 13.3 03/29/2015 0214    CMP Latest Ref Rng 03/29/2015  Glucose 65 - 99 mg/dL 97  BUN 6 - 20 mg/dL 8  Creatinine 4.31 - 5.40 mg/dL 0.86  Sodium 761 - 950 mmol/L 140  Potassium 3.5 - 5.1 mmol/L 3.4(L)  Chloride 101 - 111 mmol/L 104  CO2 22 - 32 mmol/L 26  Calcium 8.9 - 10.3 mg/dL 9.8  Total  Protein 6.5 - 8.1 g/dL 8.3(H)  Total Bilirubin 0.3 - 1.2 mg/dL 0.9  Alkaline Phos 38 - 126 U/L 112  AST 15 - 41 U/L 27  ALT 14 - 54 U/L 83(H)    Assessment:     50 yr old with symptomatic cholelithiasis      Plan:     Personally reviewed her past medical history with some exercise-induced asthma and otherwise normal. I have also personally reviewed her laboratory values which include a normal white blood cell count and normal liver enzymes except for a mildly elevated AST at 84. I have also personally reviewed her ultrasound images that do show some stones in the first one and likely shows some stones and sludge in her second and third ultrasounds as well however do not show any wall thickening or pericholecystic fluid. The ultrasound reads her stated above in the history of present illness. I also  reviewed her HIDA scan images as well as the radiology reads which show really good functionality with about 40% functionality and 94% EF with the CCK. However the patient does endorse pain with CCK induction which gives her about a 95% likelihood of having biliary dyskinesia. I explained this to the patient and her husband through the interpreter and they were willing to have the gallbladder removed giving this information and the timeframe that she's had this pain.  The risks, benefits, complications, treatment options, and expected outcomes were discussed with the patient. The possibilities of bleeding, recurrent infection, finding a normal gallbladder, perforation of viscus organs, damage to surrounding structures, bile leak, abscess formation, needing a drain placed, the need for additional procedures, reaction to medication, pulmonary aspiration,  failure to diagnose a condition, the possible need to convert to an open procedure, and creating a complication requiring transfusion or operation were discussed with the patient. The patient and family concurred with the proposed plan, giving informed  consent. They wanted this done as soon as possible, so I set them up to have the operation performed by my partner Dr. Nevin Bloodgoodeigo Pabon on March 23rd.

## 2015-05-03 ENCOUNTER — Telehealth: Payer: Self-pay | Admitting: Surgery

## 2015-05-03 NOTE — Telephone Encounter (Signed)
Called patient to let her know what she is needing to do before her surgery. I explained that she was allowed to use her inhaler the day of surgery, to be fasting the night before from midnight and not to take Ibuprofen until after surgery. I also told her to call [phone] the day before surgery so she could get the exact time of [facility] between 1-3:00 PM. Patient understood what was said and had no further questions.

## 2015-05-03 NOTE — Telephone Encounter (Signed)
Please advise Pt of pre op date/time and sx date. Sx: 05/06/15 with Dr Pabon--Laparoscopic cholecystectomy. Pre op: 05/04/15 @ 1:00pm--Pre admit in office.   Patient made aware to call [phone], between 1-3:00pm the day before surgery, to find out what time to arrive.

## 2015-05-04 ENCOUNTER — Telehealth: Payer: Self-pay | Admitting: Surgery

## 2015-05-04 ENCOUNTER — Other Ambulatory Visit: Payer: BLUE CROSS/BLUE SHIELD

## 2015-05-04 NOTE — Telephone Encounter (Signed)
Patient called and asked for you. She has some questions regarding her surgery Thursday and pre op

## 2015-05-05 ENCOUNTER — Other Ambulatory Visit: Payer: BLUE CROSS/BLUE SHIELD

## 2015-05-06 ENCOUNTER — Ambulatory Visit: Payer: BLUE CROSS/BLUE SHIELD | Admitting: Anesthesiology

## 2015-05-06 ENCOUNTER — Ambulatory Visit
Admission: RE | Admit: 2015-05-06 | Discharge: 2015-05-06 | Disposition: A | Discharge status: Home / Self Care | Payer: BLUE CROSS/BLUE SHIELD | Source: Ambulatory Visit | Attending: Surgery | Admitting: Surgery

## 2015-05-06 ENCOUNTER — Encounter
Admission: RE | Disposition: A | Discharge status: Home / Self Care | Payer: Self-pay | Source: Ambulatory Visit | Attending: Surgery

## 2015-05-06 DIAGNOSIS — K8020 Calculus of gallbladder without cholecystitis without obstruction: Secondary | ICD-10-CM | POA: Diagnosis present

## 2015-05-06 DIAGNOSIS — Z886 Allergy status to analgesic agent status: Secondary | ICD-10-CM | POA: Diagnosis not present

## 2015-05-06 DIAGNOSIS — Z79899 Other long term (current) drug therapy: Secondary | ICD-10-CM | POA: Insufficient documentation

## 2015-05-06 DIAGNOSIS — Z7951 Long term (current) use of inhaled steroids: Secondary | ICD-10-CM | POA: Insufficient documentation

## 2015-05-06 DIAGNOSIS — Z888 Allergy status to other drugs, medicaments and biological substances status: Secondary | ICD-10-CM | POA: Diagnosis not present

## 2015-05-06 DIAGNOSIS — K8010 Calculus of gallbladder with chronic cholecystitis without obstruction: Secondary | ICD-10-CM | POA: Diagnosis not present

## 2015-05-06 DIAGNOSIS — Z811 Family history of alcohol abuse and dependence: Secondary | ICD-10-CM | POA: Diagnosis not present

## 2015-05-06 DIAGNOSIS — Z885 Allergy status to narcotic agent status: Secondary | ICD-10-CM | POA: Insufficient documentation

## 2015-05-06 DIAGNOSIS — K219 Gastro-esophageal reflux disease without esophagitis: Secondary | ICD-10-CM | POA: Insufficient documentation

## 2015-05-06 DIAGNOSIS — J45990 Exercise induced bronchospasm: Secondary | ICD-10-CM | POA: Diagnosis not present

## 2015-05-06 HISTORY — PX: CHOLECYSTECTOMY: SHX55

## 2015-05-06 LAB — POCT PREGNANCY, URINE: PREG TEST UR: NEGATIVE

## 2015-05-06 SURGERY — LAPAROSCOPIC CHOLECYSTECTOMY WITH INTRAOPERATIVE CHOLANGIOGRAM
Anesthesia: General

## 2015-05-06 MED ORDER — PROMETHAZINE HCL 25 MG/ML IJ SOLN: 6.2500 mg | INTRAMUSCULAR | Status: DC | PRN

## 2015-05-06 MED ORDER — FAMOTIDINE 20 MG PO TABS
20.0000 mg | ORAL_TABLET | Freq: Once | ORAL | Status: AC
  Administered 2015-05-06: 20 mg via ORAL

## 2015-05-06 MED ORDER — FENTANYL CITRATE (PF) 100 MCG/2ML IJ SOLN
INTRAMUSCULAR | Status: AC
  Filled 2015-05-06: qty 2

## 2015-05-06 MED ORDER — BUPIVACAINE-EPINEPHRINE 0.25% -1:200000 IJ SOLN
INTRAMUSCULAR | Status: DC | PRN
  Administered 2015-05-06: 30 mL

## 2015-05-06 MED ORDER — FAMOTIDINE 20 MG PO TABS
ORAL_TABLET | ORAL | Status: AC
  Filled 2015-05-06: qty 1

## 2015-05-06 MED ORDER — GLYCOPYRROLATE 0.2 MG/ML IJ SOLN
INTRAMUSCULAR | Status: DC | PRN
  Administered 2015-05-06: 0.4 mg via INTRAVENOUS

## 2015-05-06 MED ORDER — PROPOFOL 10 MG/ML IV BOLUS
INTRAVENOUS | Status: DC | PRN
  Administered 2015-05-06: 150 mg via INTRAVENOUS

## 2015-05-06 MED ORDER — NEOSTIGMINE METHYLSULFATE 10 MG/10ML IV SOLN
INTRAVENOUS | Status: DC | PRN
  Administered 2015-05-06: 3 mg via INTRAVENOUS

## 2015-05-06 MED ORDER — CHLORHEXIDINE GLUCONATE 4 % EX LIQD: 1.0000 "application " | Freq: Once | CUTANEOUS | Status: DC

## 2015-05-06 MED ORDER — HYDROCODONE-IBUPROFEN 7.5-200 MG PO TABS: 1.0000 | ORAL_TABLET | ORAL | Status: DC | PRN

## 2015-05-06 MED ORDER — LACTATED RINGERS IV SOLN
INTRAVENOUS | Status: DC
  Administered 2015-05-06: 13:00:00 via INTRAVENOUS

## 2015-05-06 MED ORDER — KETOROLAC TROMETHAMINE 30 MG/ML IJ SOLN
INTRAMUSCULAR | Status: DC | PRN
  Administered 2015-05-06: 30 mg via INTRAVENOUS

## 2015-05-06 MED ORDER — FENTANYL CITRATE (PF) 100 MCG/2ML IJ SOLN
INTRAMUSCULAR | Status: DC | PRN
  Administered 2015-05-06: 100 ug via INTRAVENOUS
  Administered 2015-05-06: 50 ug via INTRAVENOUS
  Administered 2015-05-06: 100 ug via INTRAVENOUS

## 2015-05-06 MED ORDER — MIDAZOLAM HCL 2 MG/2ML IJ SOLN
INTRAMUSCULAR | Status: DC | PRN
  Administered 2015-05-06: 2 mg via INTRAVENOUS

## 2015-05-06 MED ORDER — FENTANYL CITRATE (PF) 100 MCG/2ML IJ SOLN
25.0000 ug | INTRAMUSCULAR | Status: DC | PRN
  Administered 2015-05-06: 25 ug via INTRAVENOUS
  Administered 2015-05-06: 50 ug via INTRAVENOUS
  Administered 2015-05-06: 25 ug via INTRAVENOUS

## 2015-05-06 MED ORDER — EPHEDRINE SULFATE 50 MG/ML IJ SOLN
INTRAMUSCULAR | Status: DC | PRN
  Administered 2015-05-06: 10 mg via INTRAVENOUS

## 2015-05-06 MED ORDER — DEXTROSE 5 % IV SOLN
2.0000 g | INTRAVENOUS | Status: AC
  Administered 2015-05-06: 2 g via INTRAVENOUS
  Filled 2015-05-06: qty 2

## 2015-05-06 MED ORDER — BUPIVACAINE-EPINEPHRINE (PF) 0.25% -1:200000 IJ SOLN
INTRAMUSCULAR | Status: AC
  Filled 2015-05-06: qty 30

## 2015-05-06 MED ORDER — LIDOCAINE HCL (CARDIAC) 20 MG/ML IV SOLN
INTRAVENOUS | Status: DC | PRN
  Administered 2015-05-06: 100 mg via INTRAVENOUS

## 2015-05-06 MED ORDER — DEXAMETHASONE SODIUM PHOSPHATE 4 MG/ML IJ SOLN
INTRAMUSCULAR | Status: DC | PRN
  Administered 2015-05-06: 5 mg via INTRAVENOUS

## 2015-05-06 MED ORDER — ROCURONIUM BROMIDE 100 MG/10ML IV SOLN
INTRAVENOUS | Status: DC | PRN
  Administered 2015-05-06: 30 mg via INTRAVENOUS

## 2015-05-06 MED ORDER — ONDANSETRON HCL 4 MG/2ML IJ SOLN
INTRAMUSCULAR | Status: DC | PRN
  Administered 2015-05-06: 4 mg via INTRAVENOUS

## 2015-05-06 MED ORDER — KETAMINE HCL 50 MG/ML IJ SOLN
INTRAMUSCULAR | Status: DC | PRN
  Administered 2015-05-06: 20 mg via INTRAMUSCULAR

## 2015-05-06 MED ORDER — DEXMEDETOMIDINE HCL 200 MCG/2ML IV SOLN
INTRAVENOUS | Status: DC | PRN
  Administered 2015-05-06: 12 ug via INTRAVENOUS

## 2015-05-06 SURGICAL SUPPLY — 37 items
APPLIER CLIP 5 13 M/L LIGAMAX5 (MISCELLANEOUS) ×2
CANISTER SUCT 1200ML W/VALVE (MISCELLANEOUS) ×2 IMPLANT
CHLORAPREP W/TINT 26ML (MISCELLANEOUS) ×2 IMPLANT
CHOLANGIOGRAM CATH TAUT (CATHETERS) IMPLANT
CLEANER CAUTERY TIP 5X5 PAD (MISCELLANEOUS) IMPLANT
CLIP APPLIE 5 13 M/L LIGAMAX5 (MISCELLANEOUS) ×1 IMPLANT
DECANTER SPIKE VIAL GLASS SM (MISCELLANEOUS) IMPLANT
DEVICE TROCAR PUNCTURE CLOSURE (ENDOMECHANICALS) IMPLANT
DRAPE C-ARM XRAY 36X54 (DRAPES) ×2 IMPLANT
ELECT REM PT RETURN 9FT ADLT (ELECTROSURGICAL) ×2
ELECTRODE REM PT RTRN 9FT ADLT (ELECTROSURGICAL) ×1 IMPLANT
ENDOPOUCH RETRIEVER 10 (MISCELLANEOUS) ×2 IMPLANT
GLOVE BIO SURGEON STRL SZ7 (GLOVE) ×10 IMPLANT
GOWN STRL REUS W/ TWL LRG LVL3 (GOWN DISPOSABLE) ×3 IMPLANT
GOWN STRL REUS W/TWL LRG LVL3 (GOWN DISPOSABLE) ×3
IRRIGATION STRYKERFLOW (MISCELLANEOUS) IMPLANT
IRRIGATOR STRYKERFLOW (MISCELLANEOUS)
IV SOD CHL 0.9% 1000ML (IV SOLUTION) ×2 IMPLANT
L-HOOK LAP DISP 36CM (ELECTROSURGICAL) ×2
LHOOK LAP DISP 36CM (ELECTROSURGICAL) ×1 IMPLANT
LIQUID BAND (GAUZE/BANDAGES/DRESSINGS) ×2 IMPLANT
NEEDLE HYPO 22GX1.5 SAFETY (NEEDLE) ×2 IMPLANT
PACK LAP CHOLECYSTECTOMY (MISCELLANEOUS) ×2 IMPLANT
PAD CLEANER CAUTERY TIP 5X5 (MISCELLANEOUS)
PENCIL ELECTRO HAND CTR (MISCELLANEOUS) ×2 IMPLANT
SCISSORS METZENBAUM CVD 33 (INSTRUMENTS) ×2 IMPLANT
SLEEVE ADV FIXATION 5X100MM (TROCAR) ×4 IMPLANT
STOPCOCK 3 WAY  REPLAC (MISCELLANEOUS) IMPLANT
SUT ETHIBOND 0 MO6 C/R (SUTURE) IMPLANT
SUT MNCRL AB 4-0 PS2 18 (SUTURE) ×2 IMPLANT
SUT VIC AB 0 CT1 36 (SUTURE) IMPLANT
SUT VICRYL 0 AB UR-6 (SUTURE) ×4 IMPLANT
SYR 20CC LL (SYRINGE) IMPLANT
TROCAR XCEL BLUNT TIP 100MML (ENDOMECHANICALS) ×2 IMPLANT
TROCAR Z-THREAD OPTICAL 5X100M (TROCAR) ×2 IMPLANT
TUBING INSUFFLATOR HI FLOW (MISCELLANEOUS) ×2 IMPLANT
WATER STERILE IRR 1000ML POUR (IV SOLUTION) IMPLANT

## 2015-05-06 NOTE — Anesthesia Preprocedure Evaluation (Signed)
Anesthesia Evaluation  Patient identified by MRN, date of birth, ID band Patient awake    Reviewed: Allergy & Precautions, H&P , NPO status , Patient's Chart, lab work & pertinent test results, reviewed documented beta blocker date and time   History of Anesthesia Complications Negative for: history of anesthetic complications  Airway Mallampati: III  TM Distance: >3 FB Neck ROM: full    Dental no notable dental hx. (+) Teeth Intact   Pulmonary neg shortness of breath, asthma , neg sleep apnea, neg COPD, neg recent URI,    Pulmonary exam normal breath sounds clear to auscultation       Cardiovascular Exercise Tolerance: Good negative cardio ROS Normal cardiovascular exam Rhythm:regular Rate:Normal     Neuro/Psych negative neurological ROS  negative psych ROS   GI/Hepatic Neg liver ROS, GERD  ,  Endo/Other  negative endocrine ROS  Renal/GU negative Renal ROS  negative genitourinary   Musculoskeletal   Abdominal   Peds  Hematology negative hematology ROS (+)   Anesthesia Other Findings Past Medical History:   Gallstones                                                   GERD (gastroesophageal reflux disease)                       Reproductive/Obstetrics negative OB ROS                             Anesthesia Physical Anesthesia Plan  ASA: II  Anesthesia Plan: General   Post-op Pain Management:    Induction:   Airway Management Planned:   Additional Equipment:   Intra-op Plan:   Post-operative Plan:   Informed Consent: I have reviewed the patients History and Physical, chart, labs and discussed the procedure including the risks, benefits and alternatives for the proposed anesthesia with the patient or authorized representative who has indicated his/her understanding and acceptance.   Dental Advisory Given  Plan Discussed with: Anesthesiologist, CRNA and Surgeon  Anesthesia  Plan Comments:         Anesthesia Quick Evaluation

## 2015-05-06 NOTE — Anesthesia Procedure Notes (Signed)
Procedure Name: Intubation Date/Time: 05/06/2015 2:24 PM Performed by: Shirlee LimerickMARION, Brin Ruggerio Pre-anesthesia Checklist: Patient identified, Emergency Drugs available, Suction available and Patient being monitored Patient Re-evaluated:Patient Re-evaluated prior to inductionOxygen Delivery Method: Circle system utilized Preoxygenation: Pre-oxygenation with 100% oxygen Intubation Type: IV induction Laryngoscope Size: Mac and 3 Grade View: Grade I Tube type: Oral Tube size: 7.0 mm Number of attempts: 1 Placement Confirmation: ETT inserted through vocal cords under direct vision,  positive ETCO2 and breath sounds checked- equal and bilateral Secured at: 21 cm Tube secured with: Tape Dental Injury: Teeth and Oropharynx as per pre-operative assessment

## 2015-05-06 NOTE — Interval H&P Note (Signed)
History and Physical Interval Note:  05/06/2015 1:33 PM  Monique Sanders  has presented today for surgery, with the diagnosis of Symptomatic cholelithasis  The various methods of treatment have been discussed with the patient and family. After consideration of risks, benefits and other options for treatment, the patient has consented to  Procedure(s): LAPAROSCOPIC CHOLECYSTECTOMY WITH INTRAOPERATIVE CHOLANGIOGRAM (N/A) as a surgical intervention .  The patient's history has been reviewed, patient examined, no change in status, stable for surgery.  I have reviewed the patient's chart and labs.  Questions were answered to the patient's satisfaction.     Yechezkel Fertig F Ethelle Ola

## 2015-05-06 NOTE — Op Note (Signed)
Laparoscopic Cholecystectomy  Pre-operative Diagnosis: Symptomatic cholelithiasis  Post-operative Diagnosis: Same  Procedure: Laparoscopic cholecystectomy  Surgeon: Sterling Bigiego Pabon, MD FACS  Anesthesia: Gen. with endotracheal tube  Findings: GB   Estimated Blood Loss: 5cc         Drains: none         Specimens: Gallbladder           Complications: none   Procedure Details  The patient was seen again in the Holding Room. The benefits, complications, treatment options, and expected outcomes were discussed with the patient. The risks of bleeding, infection, recurrence of symptoms, failure to resolve symptoms, bile duct damage, bile duct leak, retained common bile duct stone, bowel injury, any of which could require further surgery and/or ERCP, stent, or papillotomy were reviewed with the patient. The likelihood of improving the patient's symptoms with return to their baseline status is good.  The patient and/or family concurred with the proposed plan, giving informed consent.  The patient was taken to Operating Room, identified as Genene Churnosa A Maute and the procedure verified as Laparoscopic Cholecystectomy.  A Time Out was held and the above information confirmed.  Prior to the induction of general anesthesia, antibiotic prophylaxis was administered. VTE prophylaxis was in place. General endotracheal anesthesia was then administered and tolerated well. After the induction, the abdomen was prepped with Chloraprep and draped in the sterile fashion. The patient was positioned in the supine position.  Local anesthetic  was injected into the skin near the umbilicus and an incision made. Cut down technique was used to enter the abdominal cavity and a Hasson trochar was placed after two vicryl stitches were anchored to the fascia. Pneumoperitoneum was then created with CO2 and tolerated well without any adverse changes in the patient's vital signs.  Three 5-mm ports were placed in the right upper  quadrant all under direct vision. All skin incisions  were infiltrated with a local anesthetic agent before making the incision and placing the trocars.   The patient was positioned  in reverse Trendelenburg, tilted slightly to the patient's left.  The gallbladder was identified, the fundus grasped and retracted cephalad. Adhesions were lysed bluntly. The infundibulum was grasped and retracted laterally, exposing the peritoneum overlying the triangle of Calot. This was then divided and exposed in a blunt fashion. An extended critical view of the cystic duct and cystic artery was obtained.  The cystic duct was clearly identified and bluntly dissected.   Artery and duct were double clipped and divided.  The gallbladder was taken from the gallbladder fossa in a retrograde fashion with the electrocautery. The gallbladder was removed and placed in an Endocatch bag. The liver bed was irrigated and inspected. Hemostasis was achieved with the electrocautery. Copious irrigation was utilized and was repeatedly aspirated until clear.  The gallbladder and Endocatch sac were then removed through the epigastric port site.   Inspection of the right upper quadrant was performed. No bleeding, bile duct injury or leak, or bowel injury was noted. Pneumoperitoneum was released.  The periumbilical port site was closed with figure-of-eight 0 Vicryl sutures. 4-0 subcuticular Monocryl was used to close the skin. Dermabond was  applied.  The patient was then extubated and brought to the recovery room in stable condition. Sponge, lap, and needle counts were correct at closure and at the conclusion of the case.               Sterling Bigiego Pabon, MD, FACS

## 2015-05-06 NOTE — Anesthesia Postprocedure Evaluation (Signed)
Anesthesia Post Note  Patient: Monique Sanders  Procedure(s) Performed: Procedure(s) (LRB): LAPAROSCOPIC CHOLECYSTECTOMY (N/A)  Patient location during evaluation: PACU Anesthesia Type: General Level of consciousness: awake and alert and oriented Pain management: pain level controlled Vital Signs Assessment: post-procedure vital signs reviewed and stable Respiratory status: spontaneous breathing Cardiovascular status: blood pressure returned to baseline Anesthetic complications: no    Last Vitals:  Filed Vitals:   05/06/15 1745 05/06/15 1858  BP: 113/61 103/61  Pulse: 77 71  Temp: 36.3 C   Resp: 16 18    Last Pain:  Filed Vitals:   05/06/15 1900  PainSc: 3                  Rochele Lueck

## 2015-05-06 NOTE — Telephone Encounter (Signed)
Called patient back and it just kept ringing without a possibility of leaving a voicemail. I will try to call her back again.

## 2015-05-06 NOTE — Discharge Instructions (Addendum)
Laparoscopic Cholecystectomy, Care After  ° °These instructions give you information on caring for yourself after your procedure. Your doctor may also give you more specific instructions. Call your doctor if you have any problems or questions after your procedure.  °HOME CARE  °Change your bandages (dressings) as told by your doctor.  °Keep the wound dry and clean. Wash the wound gently with soap and water. Pat the wound dry with a clean towel.  °Do not take baths, swim, or use hot tubs for 2 weeks, or as told by your doctor.  °Only take medicine as told by your doctor.  °Eat a normal diet as told by your doctor.  °Do not lift anything heavier than 10 pounds (4.5 kg) until your doctor says it is okay.  °Do not play contact sports for 1 week, or as told by your doctor. °GET HELP IF:  °Your wound is red, puffy (swollen), or painful.  °You have yellowish-white fluid (pus) coming from the wound.  °You have fluid draining from the wound for more than 1 day.  °You have a bad smell coming from the wound.  °Your wound breaks open. °GET HELP RIGHT AWAY IF:  °You have trouble breathing.  °You have chest pain.  °You have a fever >101  °You have pain in the shoulders (shoulder strap areas) that is getting worse.  °You feel dizzy or pass out (faint).  °You have severe belly (abdominal) pain.  °You feel sick to your stomach (nauseous) or throw up (vomit) for more than 1 day. ° ° ° °AMBULATORY SURGERY  °DISCHARGE INSTRUCTIONS ° ° °1) The drugs that you were given will stay in your system until tomorrow so for the next 24 hours you should not: ° °A) Drive an automobile °B) Make any legal decisions °C) Drink any alcoholic beverage ° ° °2) You may resume regular meals tomorrow.  Today it is better to start with liquids and gradually work up to solid foods. ° °You may eat anything you prefer, but it is better to start with liquids, then soup and crackers, and gradually work up to solid foods. ° ° °3) Please notify your doctor  immediately if you have any unusual bleeding, trouble breathing, redness and pain at the surgery site, drainage, fever, or pain not relieved by medication. ° ° ° °4) Additional Instructions: ° ° ° ° ° ° ° °Please contact your physician with any problems or Same Day Surgery at [phone], Monday through Friday 6 am to 4 pm, or [place] [facility] [place] [facility] number at [phone]. ° ° °

## 2015-05-06 NOTE — Transfer of Care (Signed)
Immediate Anesthesia Transfer of Care Note  Patient: Monique Sanders A Hollenback  Procedure(s) Performed: Procedure(s): LAPAROSCOPIC CHOLECYSTECTOMY (N/A)  Patient Location: PACU  Anesthesia Type:General  Level of Consciousness: awake, oriented and patient cooperative  Airway & Oxygen Therapy: Patient Spontanous Breathing and Patient connected to nasal cannula oxygen  Post-op Assessment: Report given to RN and Post -op Vital signs reviewed and stable  Post vital signs: Reviewed and stable  Last Vitals:  Filed Vitals:   05/06/15 1525  BP: 131/70  Pulse: 87  Temp: 36.8 C  Resp: 18    Complications: No apparent anesthesia complications

## 2015-05-06 NOTE — H&P (View-Only) (Signed)
Subjective:     Patient ID: Monique Sanders, female   DOB: 09/02/1965, 49 y.o.   MRN: [id number]  HPI  49-year-old female which is Spanish-speaking I examined and spoke with her with the interpreter as well as her husband. Patient comes to me today with complaint of intermittent abdominal pain in the right upper quadrant and moves around her side and through to her back. Patient first started noticing this in February of this year. Patient was seen in the emergency room and had ultrasound that showed some gallstones at that time. Patient was seen by Dr. Wilton Smith who recommended a cholecystectomy however the patient did not want to have her gallbladder taken out at that time. Patient tried a home remedy of all of all and limiting juice to try and get rid of the stones and they did have a second ultrasound at the patient's request that showed resolution of the stones per the patient the read actually does still state that there is some radiodense bile sludge that remains visible with some teeny echogenic foci that are near the gallbladder neck but are not clearly evident as they were on the first ultrasound. Patient was then seen by the GI doctor with heavy set her for a HIDA scan. Patient does state that she had reproduction of the pain with the CCK that was administered. Patient does endorse pain currently and states that fatty foods cause increased pain she states her pain right now is in her right upper quadrant is about a 5 out of 10. Patient also endorses some nausea but denies having any vomiting. Patient has had some diarrhea occasionally states it's worse with greasy or spicy foods. Patient also endorses some increased bloating and gas. Patient denies any fever chills any turning yellow or jaundiced.  Past Medical History  Diagnosis Date  . Gallstones   . GERD (gastroesophageal reflux disease)    Past Surgical History  Procedure Laterality Date  . Ctr    . Diagnostic laparoscopy    .  Ovarian tube  Left    Family History  Problem Relation Age of Onset  . Alcohol abuse Father    Social History   Social History  . Marital Status: Married    Spouse Name: N/A  . Number of Children: N/A  . Years of Education: N/A   Social History Main Topics  . Smoking status: Never Smoker   . Smokeless tobacco: Never Used  . Alcohol Use: No  . Drug Use: No  . Sexual Activity: Not Asked   Other Topics Concern  . None   Social History Narrative    Current outpatient prescriptions:  .  albuterol (PROVENTIL HFA;VENTOLIN HFA) 108 (90 Base) MCG/ACT inhaler, Inhale 2 puffs into the lungs., Disp: , Rfl:  .  cetirizine (ZYRTEC) 10 MG tablet, Take 10 mg by mouth daily., Disp: , Rfl:  .  Levonorgestrel-Ethinyl Estradiol (AMETHIA,CAMRESE) 0.1-0.02 & 0.01 MG tablet, Take 1 tablet by mouth daily., Disp: , Rfl: 2 Allergies  Allergen Reactions  . Codeine   . Pamabrom   . Pamprin [Apap-Pamabrom-Pyrilamine]   . Tylenol [Acetaminophen]        Review of Systems  Constitutional: Positive for activity change, appetite change and fatigue. Negative for fever, chills and unexpected weight change.  HENT: Negative for congestion and sore throat.   Respiratory: Negative for apnea, cough, shortness of breath and wheezing.   Cardiovascular: Negative for chest pain, palpitations and leg swelling.  Gastrointestinal: Positive for nausea, abdominal pain,   diarrhea and abdominal distention. Negative for vomiting, constipation and blood in stool.  Genitourinary: Negative for dysuria, urgency and hematuria.  Musculoskeletal: Negative for back pain.  Skin: Negative for color change, pallor, rash and wound.  Neurological: Negative for weakness and light-headedness.  Hematological: Negative for adenopathy. Does not bruise/bleed easily.  Psychiatric/Behavioral: Negative for agitation. The patient is not nervous/anxious.        Filed Vitals:   04/30/15 1640  BP: 159/99  Pulse: 82  Temp: 98.5 F  (36.9 C)    Objective:   Physical Exam  Constitutional: She is oriented to person, place, and time. She appears well-developed and well-nourished. No distress.  HENT:  Head: Normocephalic and atraumatic.  Right Ear: External ear normal.  Left Ear: External ear normal.  Nose: Nose normal.  Mouth/Throat: Oropharynx is clear and moist. No oropharyngeal exudate.  Eyes: Conjunctivae and EOM are normal. Pupils are equal, round, and reactive to light. No scleral icterus.  Neck: Normal range of motion. Neck supple.  Cardiovascular: Normal rate, regular rhythm, normal heart sounds and intact distal pulses.  Exam reveals no gallop and no friction rub.   No murmur heard. Pulmonary/Chest: Effort normal and breath sounds normal. No respiratory distress. She has no wheezes. She has no rales. She exhibits no tenderness.  Abdominal: Soft. Bowel sounds are normal. She exhibits no distension. There is tenderness. There is no rebound and no guarding.  Epigastric and RUQ tenderness  Musculoskeletal: Normal range of motion. She exhibits no edema or tenderness.  Neurological: She is alert and oriented to person, place, and time. No cranial nerve deficit.  Skin: Skin is warm and dry. No rash noted. No erythema. No pallor.  Psychiatric: She has a normal mood and affect. Her behavior is normal. Judgment and thought content normal.  Vitals reviewed.      CBC    Component Value Date/Time   WBC 6.7 03/29/2015 0214   RBC 4.12 03/29/2015 0214   HGB 12.5 03/29/2015 0214   HCT 36.9 03/29/2015 0214   PLT 193 03/29/2015 0214   MCV 89.5 03/29/2015 0214   MCH 30.3 03/29/2015 0214   MCHC 33.9 03/29/2015 0214   RDW 13.3 03/29/2015 0214    CMP Latest Ref Rng 03/29/2015  Glucose 65 - 99 mg/dL 97  BUN 6 - 20 mg/dL 8  Creatinine 0.44 - 1.00 mg/dL 0.67  Sodium 135 - 145 mmol/L 140  Potassium 3.5 - 5.1 mmol/L 3.4(L)  Chloride 101 - 111 mmol/L 104  CO2 22 - 32 mmol/L 26  Calcium 8.9 - 10.3 mg/dL 9.8  Total  Protein 6.5 - 8.1 g/dL 8.3(H)  Total Bilirubin 0.3 - 1.2 mg/dL 0.9  Alkaline Phos 38 - 126 U/L 112  AST 15 - 41 U/L 27  ALT 14 - 54 U/L 83(H)    Assessment:     49 yr old with symptomatic cholelithiasis      Plan:     Personally reviewed her past medical history with some exercise-induced asthma and otherwise normal. I have also personally reviewed her laboratory values which include a normal white blood cell count and normal liver enzymes except for a mildly elevated AST at 84. I have also personally reviewed her ultrasound images that do show some stones in the first one and likely shows some stones and sludge in her second and third ultrasounds as well however do not show any wall thickening or pericholecystic fluid. The ultrasound reads her stated above in the history of present illness. I also   reviewed her HIDA scan images as well as the radiology reads which show really good functionality with about 40% functionality and 94% EF with the CCK. However the patient does endorse pain with CCK induction which gives her about a 95% likelihood of having biliary dyskinesia. I explained this to the patient and her husband through the interpreter and they were willing to have the gallbladder removed giving this information and the timeframe that she's had this pain.  The risks, benefits, complications, treatment options, and expected outcomes were discussed with the patient. The possibilities of bleeding, recurrent infection, finding a normal gallbladder, perforation of viscus organs, damage to surrounding structures, bile leak, abscess formation, needing a drain placed, the need for additional procedures, reaction to medication, pulmonary aspiration,  failure to diagnose a condition, the possible need to convert to an open procedure, and creating a complication requiring transfusion or operation were discussed with the patient. The patient and family concurred with the proposed plan, giving informed  consent. They wanted this done as soon as possible, so I set them up to have the operation performed by my partner Dr. Deigo Pabon on March 23rd.        

## 2015-05-07 ENCOUNTER — Encounter: Payer: Self-pay | Admitting: Surgery

## 2015-05-10 LAB — SURGICAL PATHOLOGY

## 2015-05-13 ENCOUNTER — Ambulatory Visit: Payer: Self-pay | Admitting: Gastroenterology

## 2015-05-13 NOTE — Telephone Encounter (Signed)
Called patient to check up on her since she had her surgery done on 05/06/2015 and had no response from her. However, I saw that she had no complications on her surgery and will follow up on 05/19/2015 with Dr. Orvis BrillLoflin.

## 2015-05-19 ENCOUNTER — Ambulatory Visit (INDEPENDENT_AMBULATORY_CARE_PROVIDER_SITE_OTHER): Payer: BLUE CROSS/BLUE SHIELD | Admitting: Surgery

## 2015-05-19 ENCOUNTER — Encounter: Payer: Self-pay | Admitting: Surgery

## 2015-05-19 VITALS — BP 138/76 | HR 61 | Temp 98.3°F | Wt 125.0 lb

## 2015-05-19 DIAGNOSIS — K8020 Calculus of gallbladder without cholecystitis without obstruction: Secondary | ICD-10-CM

## 2015-05-19 NOTE — Patient Instructions (Addendum)
En este momento usted puede ponerse una faja. Pero no puede levantar nada pesado mas de 15 lbs.  Ahora puede manejar usan una almohadita y Graybar Electricsubir los escalones de su casa.  Tambien puede comer lo que [place]. Al principio pueda que le cause diarrea pero en aproximadamente dos meses se va a poner mejor.  La goma de su abdomen se le va a caer solo. No se lo quite usted.

## 2015-05-19 NOTE — Progress Notes (Signed)
50 year old female status post lap scopic cholecystectomy for chronic cholecystitis. I did talk to her through an interpreter. Patient is doing much better still some soreness at her incision sites. Patient states that her appetite is returning as well. Patient states that she's having bowel movements normal and is not having diarrhea. And would like to return to work however she does lift heavy things at work.  Filed Vitals:   05/19/15 0928  BP: 138/76  Pulse: 61  Temp: 98.3 F (36.8 C)   PE:  Gen: NAD Abd: soft, incision sites c/d/i, no erytehma or drainage  A/P:  Patient healing well after laparoscopic cholecystectomy. Patient does lift up to about 50 pounds at work and instructed to stay out of work for another 3 weeks. Work excuse given. Encouraged call or follow-up with any questions or concerns

## 2015-05-25 ENCOUNTER — Encounter: Payer: Self-pay | Admitting: Emergency Medicine

## 2015-05-25 ENCOUNTER — Emergency Department
Admission: EM | Admit: 2015-05-25 | Discharge: 2015-05-25 | Disposition: A | Discharge status: Home / Self Care | Payer: BLUE CROSS/BLUE SHIELD | Attending: Emergency Medicine | Admitting: Emergency Medicine

## 2015-05-25 ENCOUNTER — Emergency Department: Payer: BLUE CROSS/BLUE SHIELD

## 2015-05-25 DIAGNOSIS — J45909 Unspecified asthma, uncomplicated: Secondary | ICD-10-CM | POA: Diagnosis not present

## 2015-05-25 DIAGNOSIS — R109 Unspecified abdominal pain: Secondary | ICD-10-CM | POA: Insufficient documentation

## 2015-05-25 HISTORY — DX: Unspecified asthma, uncomplicated: J45.909

## 2015-05-25 LAB — POCT PREGNANCY, URINE: PREG TEST UR: NEGATIVE

## 2015-05-25 LAB — COMPREHENSIVE METABOLIC PANEL
ALK PHOS: 55 U/L (ref 38–126)
ALT: 28 U/L (ref 14–54)
ANION GAP: 5 (ref 5–15)
AST: 21 U/L (ref 15–41)
Albumin: 4 g/dL (ref 3.5–5.0)
BUN: 13 mg/dL (ref 6–20)
CHLORIDE: 107 mmol/L (ref 101–111)
CO2: 23 mmol/L (ref 22–32)
Calcium: 8.6 mg/dL — ABNORMAL LOW (ref 8.9–10.3)
Creatinine, Ser: 0.58 mg/dL (ref 0.44–1.00)
Glucose, Bld: 105 mg/dL — ABNORMAL HIGH (ref 65–99)
POTASSIUM: 3.6 mmol/L (ref 3.5–5.1)
Sodium: 135 mmol/L (ref 135–145)
TOTAL PROTEIN: 7.2 g/dL (ref 6.5–8.1)
Total Bilirubin: 0.7 mg/dL (ref 0.3–1.2)

## 2015-05-25 LAB — URINALYSIS COMPLETE WITH MICROSCOPIC (ARMC ONLY)
BILIRUBIN URINE: NEGATIVE
GLUCOSE, UA: NEGATIVE mg/dL
KETONES UR: NEGATIVE mg/dL
LEUKOCYTES UA: NEGATIVE
NITRITE: NEGATIVE
PH: 7 (ref 5.0–8.0)
Protein, ur: NEGATIVE mg/dL
SPECIFIC GRAVITY, URINE: 1.003 — AB (ref 1.005–1.030)

## 2015-05-25 LAB — CBC WITH DIFFERENTIAL/PLATELET
BASOS ABS: 0 10*3/uL (ref 0–0.1)
Basophils Relative: 1 %
Eosinophils Absolute: 0.2 10*3/uL (ref 0–0.7)
Eosinophils Relative: 3 %
HEMATOCRIT: 34.8 % — AB (ref 35.0–47.0)
HEMOGLOBIN: 11.8 g/dL — AB (ref 12.0–16.0)
LYMPHS ABS: 1.1 10*3/uL (ref 1.0–3.6)
LYMPHS PCT: 14 %
MCH: 30.4 pg (ref 26.0–34.0)
MCHC: 34.1 g/dL (ref 32.0–36.0)
MCV: 89.2 fL (ref 80.0–100.0)
Monocytes Absolute: 0.4 10*3/uL (ref 0.2–0.9)
Monocytes Relative: 5 %
NEUTROS ABS: 6.1 10*3/uL (ref 1.4–6.5)
NEUTROS PCT: 77 %
Platelets: 221 10*3/uL (ref 150–440)
RBC: 3.9 MIL/uL (ref 3.80–5.20)
RDW: 13.8 % (ref 11.5–14.5)
WBC: 7.8 10*3/uL (ref 3.6–11.0)

## 2015-05-25 LAB — PREGNANCY, URINE: Preg Test, Ur: NEGATIVE

## 2015-05-25 LAB — TROPONIN I: Troponin I: <0.03 ng/mL (ref <0.031)

## 2015-05-25 LAB — LIPASE, BLOOD: LIPASE: 21 U/L (ref 11–51)

## 2015-05-25 MED ORDER — POLYETHYLENE GLYCOL 3350 17 G PO PACK: 17.0000 g | PACK | Freq: Every day | ORAL | Status: DC

## 2015-05-25 MED ORDER — ONDANSETRON HCL 4 MG/2ML IJ SOLN
4.0000 mg | Freq: Once | INTRAMUSCULAR | Status: AC
  Administered 2015-05-25: 4 mg via INTRAVENOUS

## 2015-05-25 MED ORDER — IOPAMIDOL (ISOVUE-300) INJECTION 61%
100.0000 mL | Freq: Once | INTRAVENOUS | Status: AC | PRN
  Administered 2015-05-25: 100 mL via INTRAVENOUS

## 2015-05-25 MED ORDER — SODIUM CHLORIDE 0.9 % IV BOLUS (SEPSIS)
1000.0000 mL | Freq: Once | INTRAVENOUS | Status: AC
  Administered 2015-05-25: 1000 mL via INTRAVENOUS

## 2015-05-25 MED ORDER — DIATRIZOATE MEGLUMINE & SODIUM 66-10 % PO SOLN
15.0000 mL | ORAL | Status: AC
  Administered 2015-05-25: 15 mL via ORAL
  Administered 2015-05-25: 30 mL via ORAL
  Filled 2015-05-25: qty 30

## 2015-05-25 MED ORDER — ONDANSETRON HCL 4 MG/2ML IJ SOLN
INTRAMUSCULAR | Status: AC
  Administered 2015-05-25: 4 mg via INTRAVENOUS
  Filled 2015-05-25: qty 2

## 2015-05-25 NOTE — ED Notes (Addendum)
Pt enema completed by this tech, this tech remains at bedside.

## 2015-05-25 NOTE — ED Notes (Signed)
Pt up using bedside commode at this time, this tech at pt bedside

## 2015-05-25 NOTE — ED Provider Notes (Signed)
[facility] Emergency Department Provider Note  ____________________________________________  Time seen: Approximately 8:34 AM  I have reviewed the triage vital signs and the nursing notes.   HISTORY  Chief Complaint Abdominal Pain    HPI Monique Sanders is a 50 y.o. female who had a laparoscopic cholecystectomy about 2 weeks ago. Patient reports her belly is been hurting with pains been improving every day. Patient reports a lot of pain in her belly when she tries to walk or sit up. Chest pain and pressure on the right side of the abdomen under the ribs. Patient doesn't have any pain in the chest. Today patient took her pain medicine about 5:00 and then an hour or so later was laying in bed and got lightheaded and sweaty and felt like she might pass out. This made her very anxious. Therefore she came into the hospital. He said at one point she opened her eyes and everything was black and she couldn't see her body was shaking and that was when she was sweaty. She reports she is okay now as long she is lying down but when she got up to go to the bathroom she still felt lightheaded.  Past Medical History  Diagnosis Date  . Gallstones   . GERD (gastroesophageal reflux disease)   . Asthma     There are no active problems to display for this patient.   Past Surgical History  Procedure Laterality Date  . Ctr    . Diagnostic laparoscopy    . Ovarian tube  Left   . Cholecystectomy N/A 05/06/2015    Procedure: LAPAROSCOPIC CHOLECYSTECTOMY;  Surgeon: Leafy Roiego F Pabon, MD;  Location: [facility] ORS;  Service: General;  Laterality: N/A;    Current Outpatient Rx  Name  Route  Sig  Dispense  Refill  . albuterol (PROVENTIL HFA;VENTOLIN HFA) 108 (90 Base) MCG/ACT inhaler   Inhalation   Inhale 2 puffs into the lungs.         . cetirizine (ZYRTEC) 10 MG tablet   Oral   Take 10 mg by mouth daily.         Marland Kitchen. ibuprofen (ADVIL,MOTRIN) 200 MG tablet   Oral   Take 200 mg by  mouth every 6 (six) hours as needed.         . Levonorgestrel-Ethinyl Estradiol (AMETHIA,CAMRESE) 0.1-0.02 & 0.01 MG tablet   Oral   Take 1 tablet by mouth daily.      2     Allergies Codeine; Pamprin; Tylenol; and Pamabrom  Family History  Problem Relation Age of Onset  . Alcohol abuse Father     Social History Social History  Substance Use Topics  . Smoking status: Never Smoker   . Smokeless tobacco: Never Used  . Alcohol Use: No    Review of Systems Constitutional: No fever/chills Eyes: No visual changes. ENT: No sore throat. Cardiovascular: Denies chest pain. Respiratory: Denies shortness of breath. Gastrointestinal: See history of present illness Genitourinary: Negative for dysuria. Musculoskeletal: Negative for back pain. Skin: Negative for rash.  10-point ROS otherwise negative.  ____________________________________________   PHYSICAL EXAM:  VITAL SIGNS: ED Triage Vitals  Enc Vitals Group     BP 05/25/15 0634 119/74 mmHg     Pulse Rate 05/25/15 0634 75     Resp 05/25/15 0634 20     Temp 05/25/15 0634 98.9 F (37.2 C)     Temp Source 05/25/15 0634 Oral     SpO2 05/25/15 0634 97 %  Weight 05/25/15 0634 125 lb (56.7 kg)     Height 05/25/15 0634  (1.575 m)     Head Cir --      Peak Flow --      Pain Score 05/25/15 0635 5     Pain Loc --      Pain Edu? --      Excl. in GC? --     Constitutional: Alert and oriented. Well appearing and in no acute distress. Eyes: Conjunctivae are normal. PERRL. EOMI. Head: Atraumatic. Nose: No congestion/rhinnorhea. Mouth/Throat: Mucous membranes are moist.  Oropharynx non-erythematous. Neck: No stridor.   Cardiovascular: Normal rate, regular rhythm. Grossly normal heart sounds.  Good peripheral circulation. Respiratory: Normal respiratory effort.  No retractions. Lungs CTAB. Gastrointestinal: Soft Tender to right upper quadrant around the umbilicus where the incisions are. There is no tenderness  elsewhere No distention. No abdominal bruits. No CVA tenderness. Musculoskeletal: No lower extremity tenderness nor edema.  No joint effusions. Neurologic:  Normal speech and language. No gross focal neurologic deficits are appreciated. No gait instability. Skin:  Skin is warm, dry and intact. No rash noted.   ____________________________________________   LABS (all labs ordered are listed, but only abnormal results are displayed)  Labs Reviewed  CBC WITH DIFFERENTIAL/PLATELET - Abnormal; Notable for the following:    Hemoglobin 11.8 (*)    HCT 34.8 (*)    All other components within normal limits  COMPREHENSIVE METABOLIC PANEL - Abnormal; Notable for the following:    Glucose, Bld 105 (*)    Calcium 8.6 (*)    All other components within normal limits  URINALYSIS COMPLETEWITH MICROSCOPIC ([facility] ONLY) - Abnormal; Notable for the following:    Color, Urine COLORLESS (*)    APPearance CLEAR (*)    Specific Gravity, Urine 1.003 (*)    Hgb urine dipstick 1+ (*)    Bacteria, UA RARE (*)    Squamous Epithelial / LPF 0-5 (*)    All other components within normal limits  LIPASE, BLOOD  TROPONIN I  PREGNANCY, URINE  TROPONIN I  POCT PREGNANCY, URINE   ____________________________________________  EKG  EKG read and interpreted by me shows normal sinus rhythm rate of 67 normal axis no acute changes ____________________________________________  RADIOLOGY  CT shows post surgical changes in the gallbladder fossa and moderate amount of stool throughout the colon. Per radiology ____________________________________________   PROCEDURES    ____________________________________________   INITIAL IMPRESSION / ASSESSMENT AND PLAN / ED COURSE  Pertinent labs & imaging results that were available during my care of the patient were reviewed by me and considered in my medical decision making (see chart for details).  Troponins have been negative patient and her husband no one to talk  to the surgeon so we'll let them stay here at the request until I do that I will give her an enema to treat the stool in the colon in the meantime. I'm uncertain why the patient got dizzy and lightheaded although it could be from the pain medication. Troponins were negative twice patient otherwise looks well except for her abdominal pain which really seems to be worse than it should be after 2 weeks. Dr. Derrill Kay will watch the patient until the surgeon comes. ____________________________________________   FINAL CLINICAL IMPRESSION(S) / ED DIAGNOSES  Final diagnoses:  Abdominal pain, unspecified abdominal location      Arnaldo Natal, MD 05/25/15 1557

## 2015-05-25 NOTE — ED Notes (Signed)
Pt presents to ED via EMS from personal home with c/o of abdominal pain post gallbladder laparoscopic procedure x2 weeks ago. Pt states pt began this AM and experienced accompanying near syncope episode per EMS. Pt arrived to treatment room alert and oriented x4.

## 2015-05-25 NOTE — Progress Notes (Signed)
S/p lap chole 3/23 came to the ER for near syncope. CT scan revealed no evidence of biloma, injuries or any acute abnormalities. Constipation. Given crystalloids and enema w significant improvement. Her pain is mainly on her Right chest wall.  PE NAD Chest: right sided chest wall tenderness Abd: soft, incision c/d/i  A/p Near syncopal episode likely multifactorial, vasovagal given constipation associated w narcotics intake and dehydration. Advice to stay hydrated and use miralax and stools softner. She can keep her appt w us. No complications related to her surgery

## 2015-05-25 NOTE — Discharge Instructions (Signed)
Please seek medical attention for any high fevers, chest pain, shortness of breath, change in behavior, persistent vomiting, bloody stool or any other new or concerning symptoms.   Dolor abdominal en adultos (Abdominal Pain, Adult) El dolor de estmago (abdominal) puede tener muchas causas. La mayora de las veces, el dolor de Pav[place]mago no es peligroso. Muchos de Franklin Resourcesestos casos de dolor de estmago pueden controlarse y tratarse en casa. CUIDADOS EN EL HOGAR   No tome medicamentos que lo ayuden a defecar (laxantes), salvo que su mdico se lo indique.  Solo tome los medicamentos que le haya indicado su mdico.  Coma o beba lo que le indique su mdico. Su mdico le dir si debe seguir una dieta especial. SOLICITE AYUDA SI:  No sabe cul es la causa del dolor de [place].  Tiene dolor de estmago cuando siente ganas de vomitar (nuseas) o tiene colitis (diarrea).  Tiene dolor durante la miccin o la evacuacin.  El dolor de estmago lo despierta de noche.  Tiene dolor de Mirantestmago que empeora o Cloudcroftmejora cuando come.  Tiene dolor de Mirantestmago que empeora cuando come CIGNAalimentos grasosos.  Tiene fiebre. SOLICITE AYUDA DE INMEDIATO SI:   El dolor no desaparece en un plazo mximo de 2horas.  No deja de (vomitar).  El dolor cambia y se Librarian, academiclocaliza solo en la parte derecha o izquierda del [place]mago.  La materia fecal es sanguinolenta o de aspecto alquitranado. ASEGRESE DE QUE:   Comprende estas instrucciones.  Controlar su afeccin.  Recibir ayuda de inmediato si no mejora o si empeora.   Esta informacin no tiene Theme park managercomo fin reemplazar el consejo del mdico. Asegrese de hacerle al mdico cualquier pregunta que tenga.   Document Released: 04/28/2008 Document Revised: 02/20/2014 Elsevier Interactive Patient Education [employer]2016 Elsevier Inc.

## 2015-05-25 NOTE — ED Notes (Signed)
Pt states she is feeling better since having an enema and wants to be discharged home and follow up with the surgical group.. [facility]..Marland Kitchen

## 2015-06-03 ENCOUNTER — Ambulatory Visit (INDEPENDENT_AMBULATORY_CARE_PROVIDER_SITE_OTHER): Payer: BLUE CROSS/BLUE SHIELD | Admitting: General Surgery

## 2015-06-03 ENCOUNTER — Encounter: Payer: Self-pay | Admitting: General Surgery

## 2015-06-03 VITALS — BP 133/81 | HR 73 | Temp 98.3°F | Wt 121.0 lb

## 2015-06-03 DIAGNOSIS — R1033 Periumbilical pain: Secondary | ICD-10-CM

## 2015-06-03 MED ORDER — TRAMADOL-ACETAMINOPHEN 37.5-325 MG PO TABS: 2.0000 | ORAL_TABLET | Freq: Four times a day (QID) | ORAL | Status: DC | PRN

## 2015-06-03 NOTE — Patient Instructions (Addendum)
En este momento usted puede ponerse una faja. Pero no puede levantar nada pesado mas de 15 lbs.  Ahora puede manejar usan una almohadita y Graybar Electricsubir los escalones de su casa.  Tambien puede comer lo que [place]. Al principio pueda que le cause diarrea pero en aproximadamente dos meses se va a poner mejor.  La goma de su abdomen se le va a caer solo. No se lo quite usted.   Su cita para el Ultrasonido va ser [employer] [address] a las 8:45 AM en el edificio [facility]. Por favor no coma nada despues de la media noche. Cuando tengamos el resultado, la llamaremos.

## 2015-06-03 NOTE — Progress Notes (Signed)
Outpatient Surgical Follow Up  06/03/2015  Monique Sanders is an 50 y.o. female.   Chief Complaint  Patient presents with  . Routine Post Op    Laparoscopic Cholecystectomy 05/06/2015 Dr. Everlene FarrierPabon    HPI: Almost 1 month status post laparoscopic cholecystectomy. Her postoperative course has been complicated by persistent abdominal pain that has not been improving. She denies any fevers, chills, nausea, vomiting, chest pain, shortness of breath. Her primary complaints are of periumbilical abdominal pain have intermittent constipation. Constipation is improving with diet and hydration.  Past Medical History  Diagnosis Date  . Gallstones   . GERD (gastroesophageal reflux disease)   . Asthma     Past Surgical History  Procedure Laterality Date  . Ctr    . Diagnostic laparoscopy    . Ovarian tube  Left   . Cholecystectomy N/A 05/06/2015    Procedure: LAPAROSCOPIC CHOLECYSTECTOMY;  Surgeon: Leafy Roiego F Pabon, MD;  Location: [facility] ORS;  Service: General;  Laterality: N/A;    Family History  Problem Relation Age of Onset  . Alcohol abuse Father     Social History:  reports that she has never smoked. She has never used smokeless tobacco. She reports that she does not drink alcohol or use illicit drugs.  Allergies:  Allergies  Allergen Reactions  . Codeine Shortness Of Breath  . Pamprin [Apap-Pamabrom-Pyrilamine] Shortness Of Breath  . Tylenol [Acetaminophen] Shortness Of Breath  . Pamabrom     Medications reviewed.    ROS A multipoint review of systems was completed, all pertinent positives and negatives are documented within the history of present illness the remainder negative.   BP 133/81 mmHg  Pulse 73  Temp(Src) 98.3 F (36.8 C) (Oral)  Wt 54.885 kg (121 lb)  Physical Exam Gen.: No acute distress Chest: Clear to auscultation Heart: Regular rhythm Abdomen: Soft, exquisitely tender to palpation around the umbilical trocar site, nondistended. The incision  sites    No results found for this or any previous visit (from the past 48 hour(s)). No results found.  Assessment/Plan:  1. Periumbilical abdominal pain 50 year old female with persistent periumbilical abdominal pain 1 month status post laparoscopic cholecystectomy. The extent of her tenderness far exceeds that which I would expect being this far out from surgery. Per her history and her exam I am concerned for an incisional site hernia however I cannot palpate a fascial defect at this time. I will obtain an ultrasound the abdomen to look for set hernia. She's been taking ibuprofen for pain control but it is not providing adequate relief and I will prescribe her Ultram at her request and patient will follow-up with her operative surgeon after obtaining her ultrasound. - US Abdomen Limited; Future     Ricarda Frameharles Monta Maiorana, MD FACS General Surgeon  06/03/2015,10:14 AM

## 2015-06-08 ENCOUNTER — Ambulatory Visit
Admission: RE | Admit: 2015-06-08 | Discharge: 2015-06-08 | Disposition: A | Discharge status: Home / Self Care | Payer: BLUE CROSS/BLUE SHIELD | Source: Ambulatory Visit | Attending: General Surgery | Admitting: General Surgery

## 2015-06-08 DIAGNOSIS — R1033 Periumbilical pain: Secondary | ICD-10-CM | POA: Diagnosis present

## 2015-06-08 DIAGNOSIS — Z9049 Acquired absence of other specified parts of digestive tract: Secondary | ICD-10-CM | POA: Diagnosis not present

## 2015-06-09 ENCOUNTER — Encounter: Payer: BLUE CROSS/BLUE SHIELD | Admitting: Surgery

## 2015-06-10 ENCOUNTER — Encounter: Payer: Self-pay | Admitting: Surgery

## 2015-06-10 ENCOUNTER — Ambulatory Visit (INDEPENDENT_AMBULATORY_CARE_PROVIDER_SITE_OTHER): Payer: BLUE CROSS/BLUE SHIELD | Admitting: Surgery

## 2015-06-10 VITALS — BP 139/70 | HR 79 | Temp 98.3°F | Wt 124.0 lb

## 2015-06-10 DIAGNOSIS — Z09 Encounter for follow-up examination after completed treatment for conditions other than malignant neoplasm: Secondary | ICD-10-CM

## 2015-06-10 NOTE — Patient Instructions (Addendum)
Por favor tome Ibuprofen y Ultram si de verdad necesita para Chief Technology Officerel dolor.  Si continua con Chief Technology Officerel dolor, nos deja saber para referirla a un especialista de dolor.

## 2015-06-11 NOTE — Progress Notes (Signed)
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 Extensive w/u has been performed including CT A/P which showed no hernia or lap chole complication and abdominal wall U/S showing no hernias She is taking PO, ambulating well  PE NAD Chest: exquisite tenderness on chest wall  Abd: soft, incision c/d/i, no infection, hematomas or hernias  A/P IC neuritis, d/w pt options and currently she is improving, does not wasn't to try more meds No complications related to lap chole RTC prn Extensive counseling provided It appears on the chart that she has an allergy to tylenol but she took ultracet w/o any issues, explained to her that her tylenol allergy is very unlikely and was secondary to the other components of pamabron

## 2016-04-19 IMAGING — US US ABDOMEN LIMITED
1 series · 14 of 25 positions shown · non-contrast
Comparison: Abdominal pelvic CT scan dated April 02, 2006.

CLINICAL DATA: Chronic epigastric and right upper quadrant
discomfort.

EXAM:
US ABDOMEN LIMITED - RIGHT UPPER QUADRANT

[Series 1: us abdomen limited · 0.20mm/px · 14 of 50 slices shown]
[im 1/50]
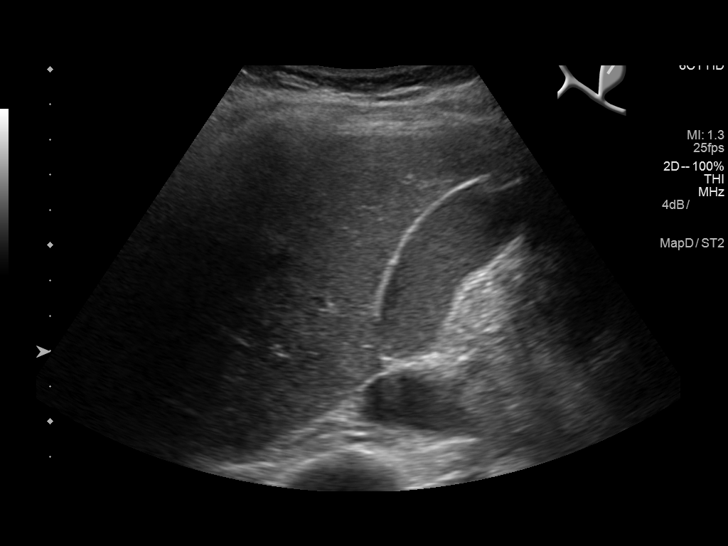
[im 5/50]
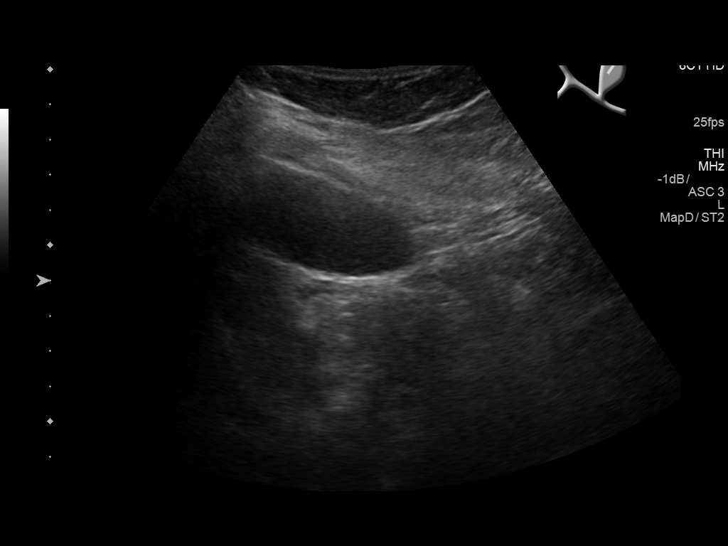
[im 9/50]
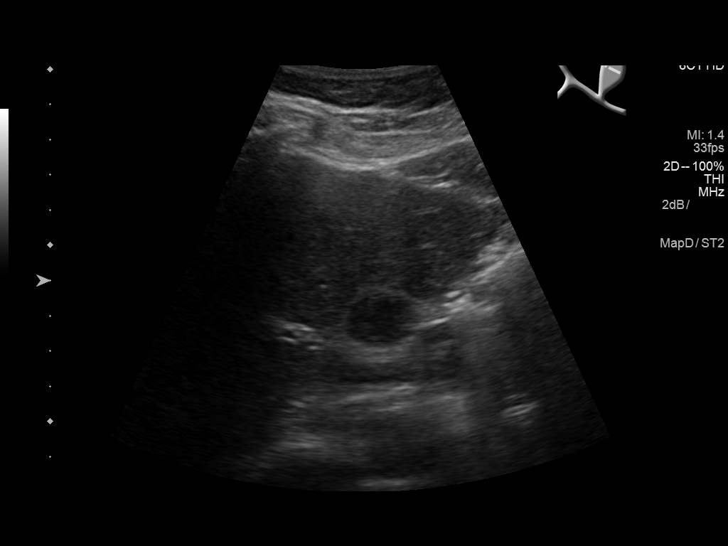
[im 13/50]
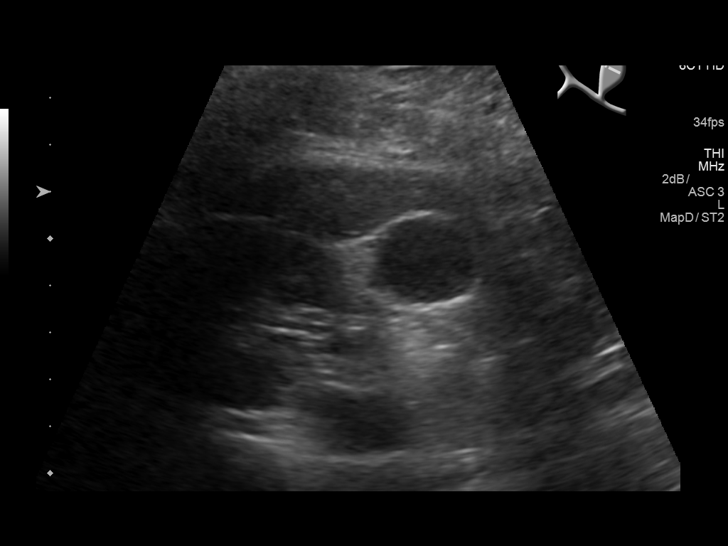
[im 17/50]
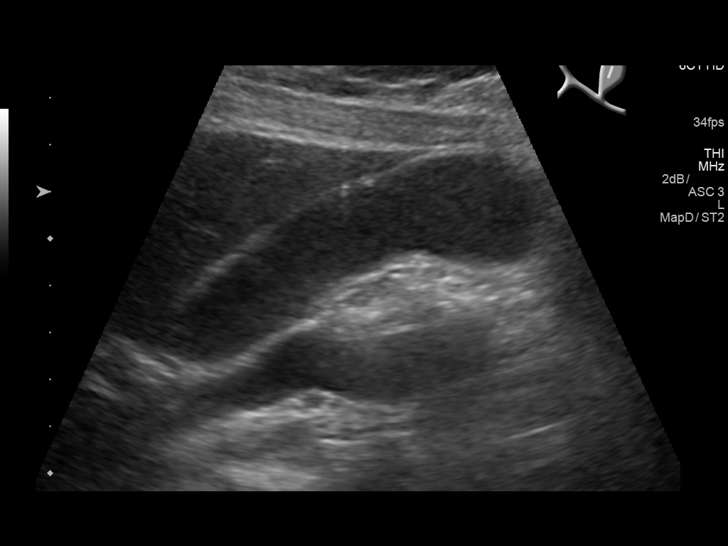
[im 19/50]
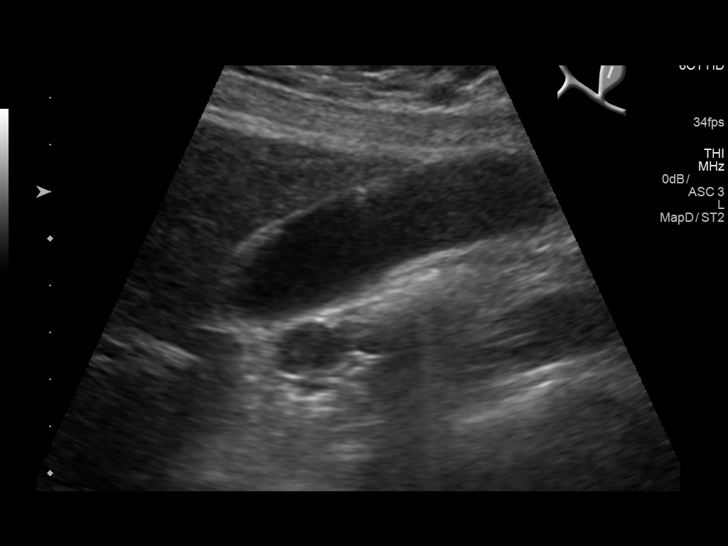
[im 23/50]
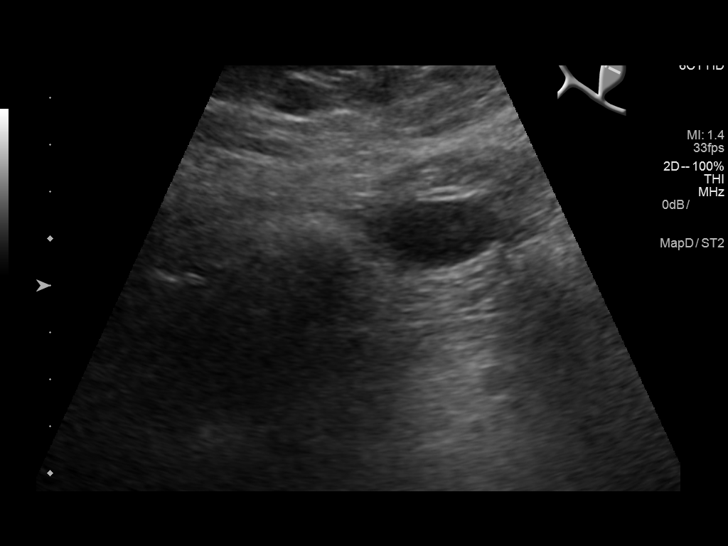
[im 27/50]
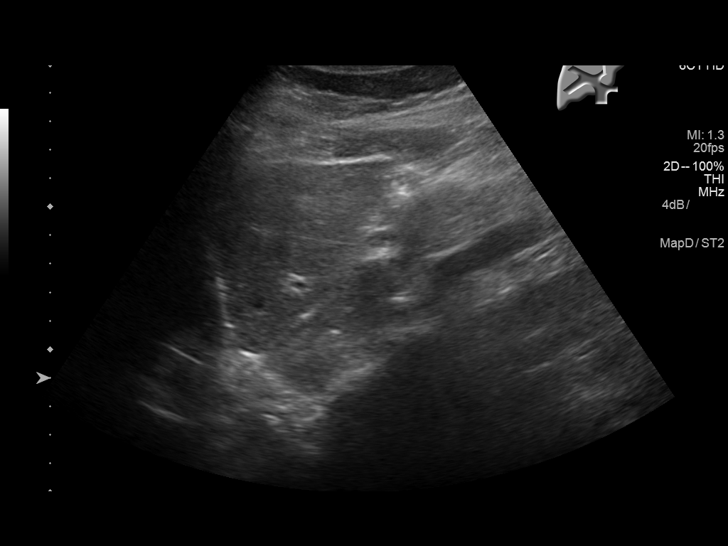
[im 31/50]
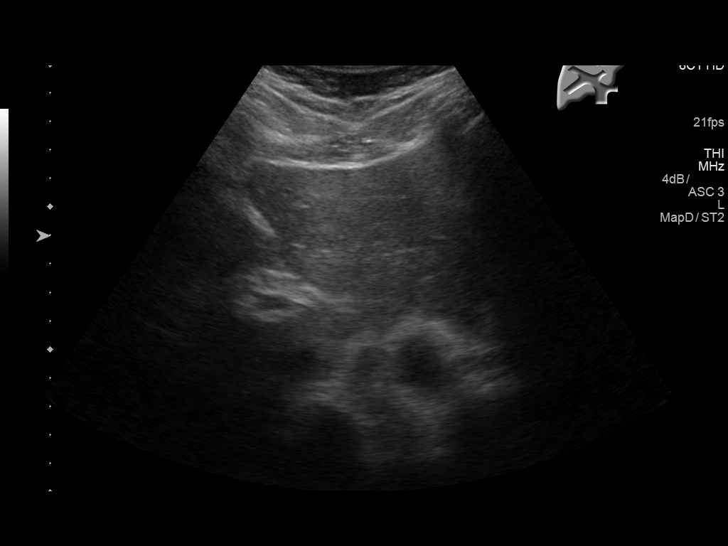
[im 33/50]
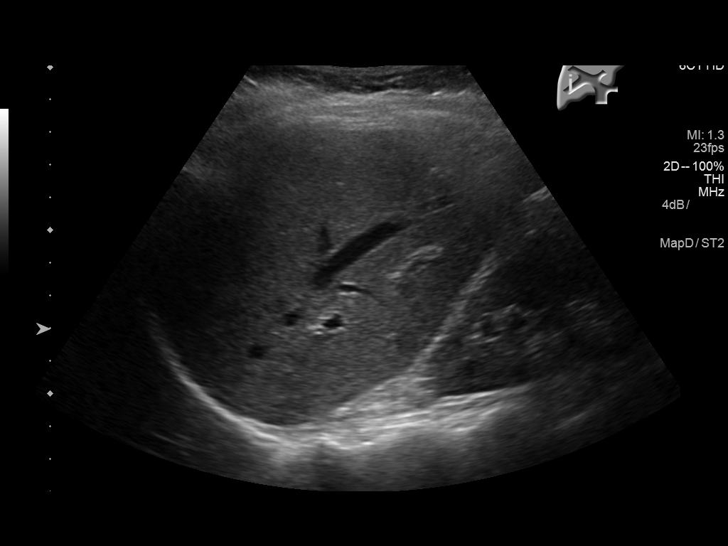
[im 37/50]
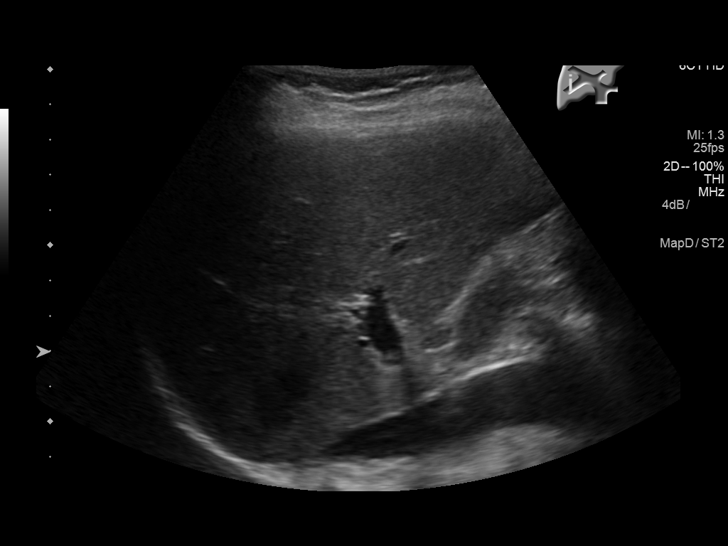
[im 41/50]
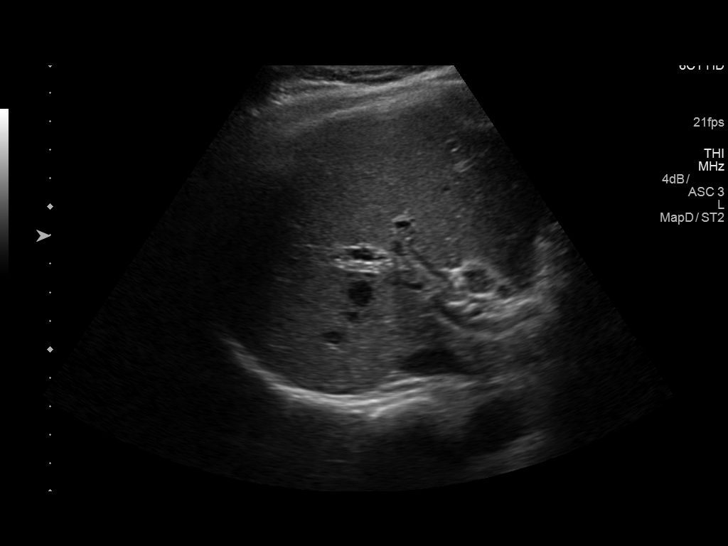
[im 45/50]
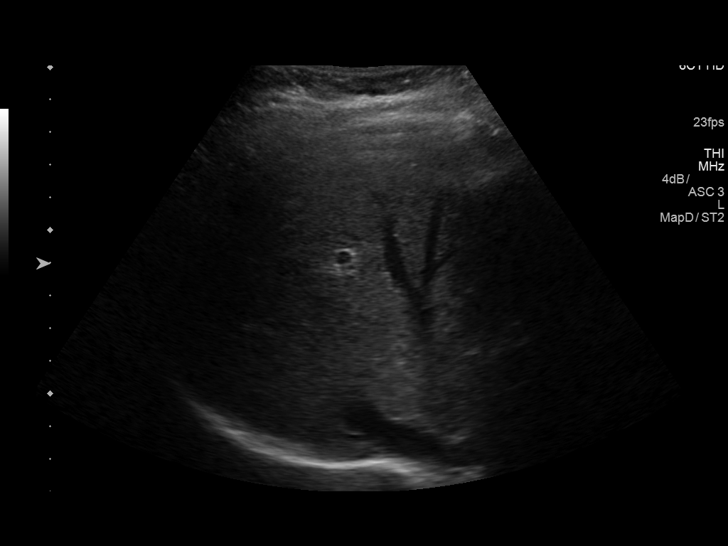
[im 50/50]
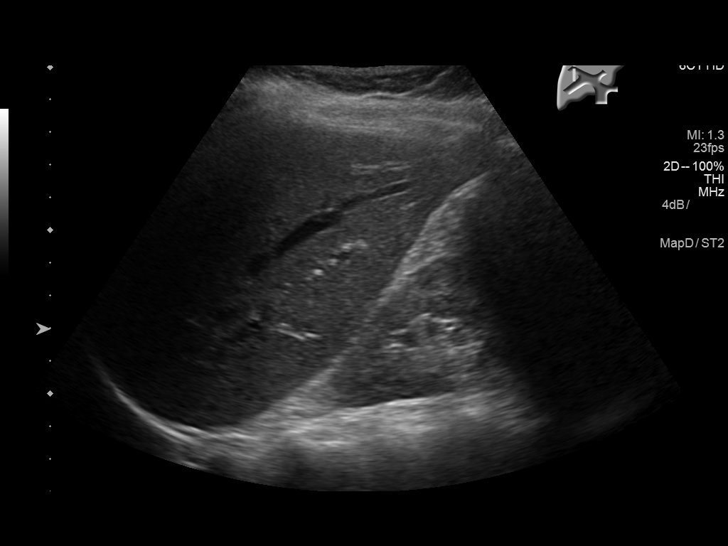

[14 of 25 positions shown; findings below may reference images not displayed]

FINDINGS: Gallbladder:

The gallbladder is adequately distended. There is echogenic bile or
sludge as well as a few small stones near the gallbladder neck.
Probable cholesterolosis of the gallbladder wall is demonstrated
with twinkle artifact demonstrated. There is no gallbladder wall
thickening, pericholecystic fluid, or positive sonographic Murphy's
sign.

Common bile duct:

Diameter: 2.5 mm

Liver:

The liver exhibits normal echotexture with no focal mass nor ductal
dilation. The surface contour of the liver appears normal.
IMPRESSION: 1. Gallstones, sludge, and findings suggesting cholesterolosis. This
may indicate underlying chronic cholecystitis.
2. No acute gallbladder or common bile duct pathology.

## 2016-04-27 ENCOUNTER — Other Ambulatory Visit: Payer: Self-pay | Admitting: Obstetrics & Gynecology

## 2016-04-27 DIAGNOSIS — Z1231 Encounter for screening mammogram for malignant neoplasm of breast: Secondary | ICD-10-CM

## 2016-04-29 IMAGING — US US PELVIS COMPLETE
1 series · 14 of 25 positions shown · non-contrast
Comparison: CT abdomen and pelvis 04/02/2006. Ultrasound pelvis
03/22/2006.

CLINICAL DATA: Heavy menses.  Second period this month.

EXAM:
TRANSABDOMINAL AND TRANSVAGINAL ULTRASOUND OF PELVIS
TECHNIQUE: Both transabdominal and transvaginal ultrasound examinations of the
pelvis were performed. Transabdominal technique was performed for
global imaging of the pelvis including uterus, ovaries, adnexal
regions, and pelvic cul-de-sac. It was necessary to proceed with
endovaginal exam following the transabdominal exam to visualize the
ovaries and endometrium.

[Series 1: us pelvis complete · 0.24mm/px · 14 of 82 slices shown]
[im 1/82]
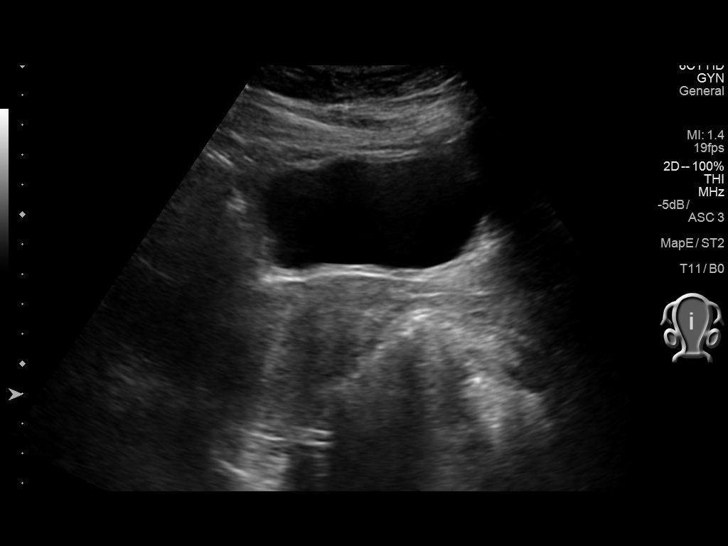
[im 7/82]
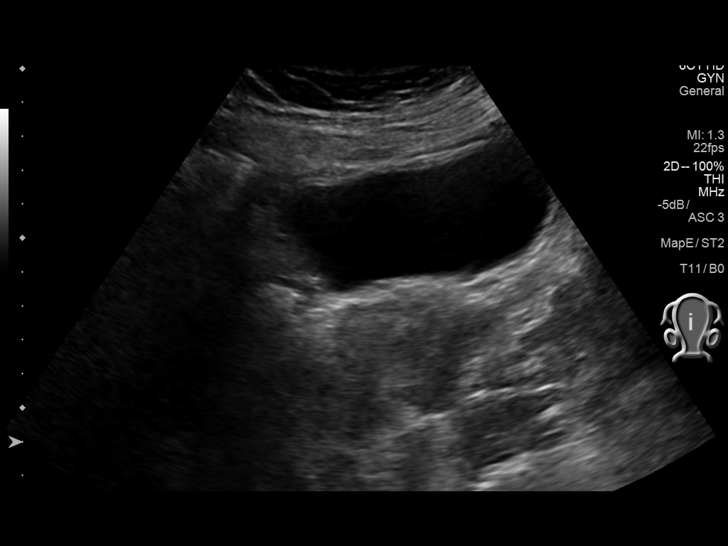
[im 14/82]
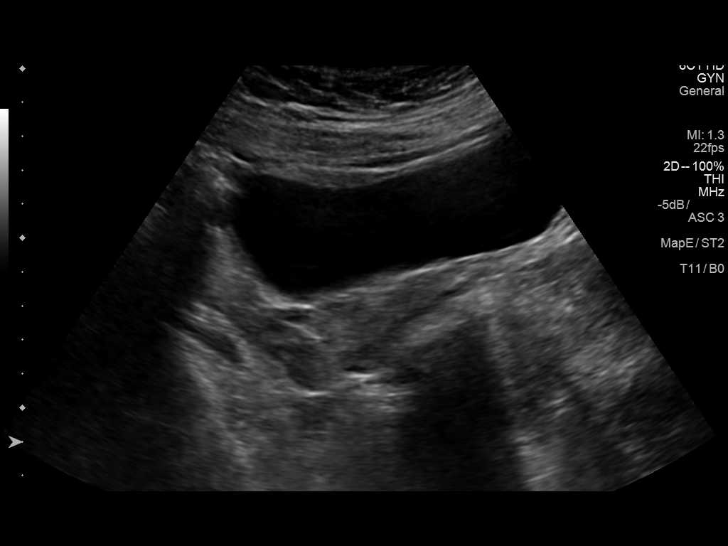
[im 21/82]
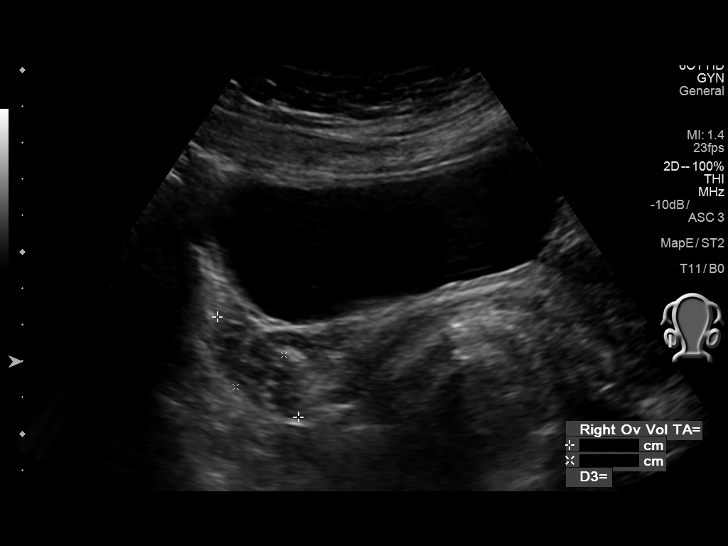
[im 28/82]
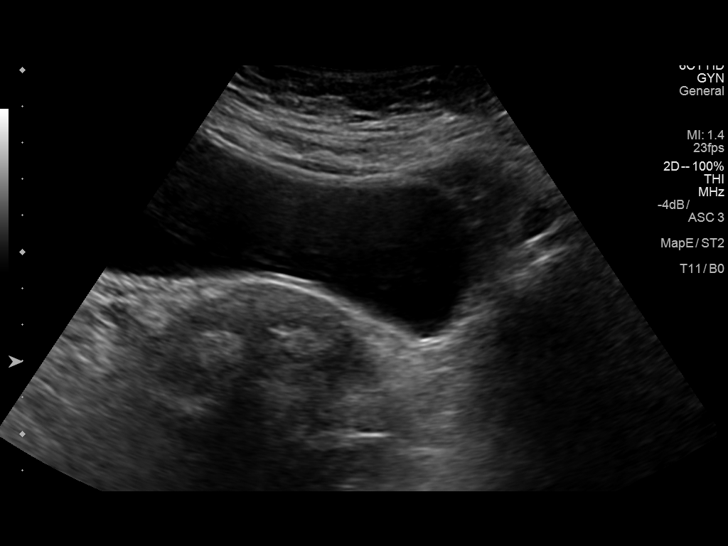
[im 31/82]
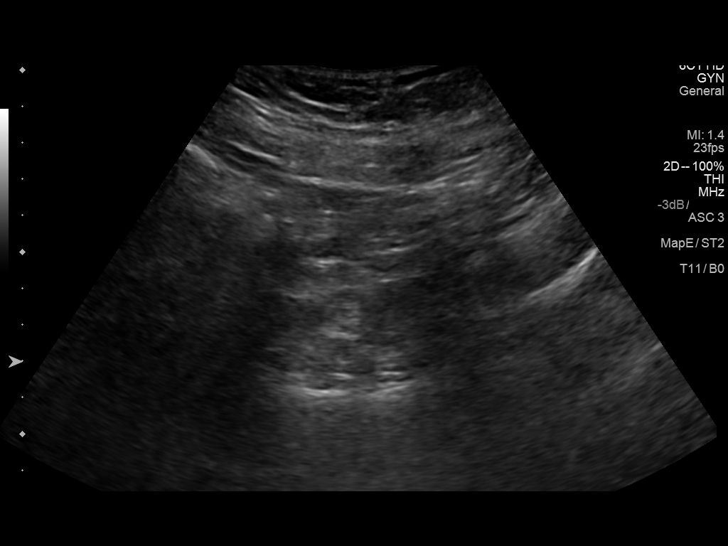
[im 38/82]
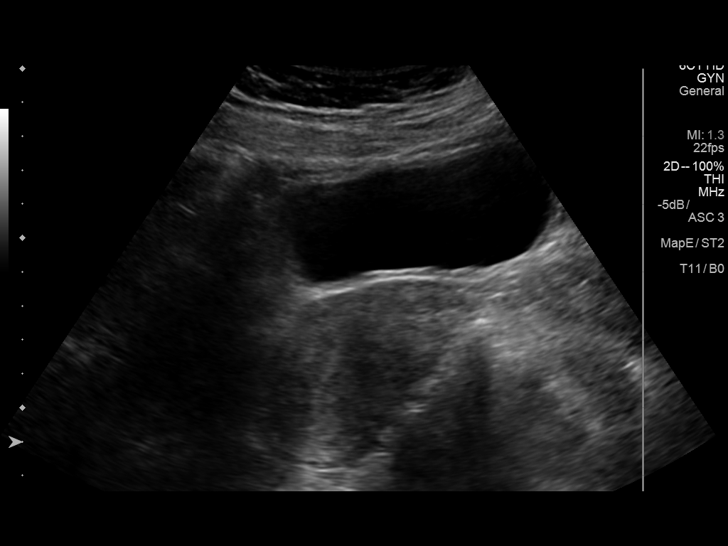
[im 44/82]
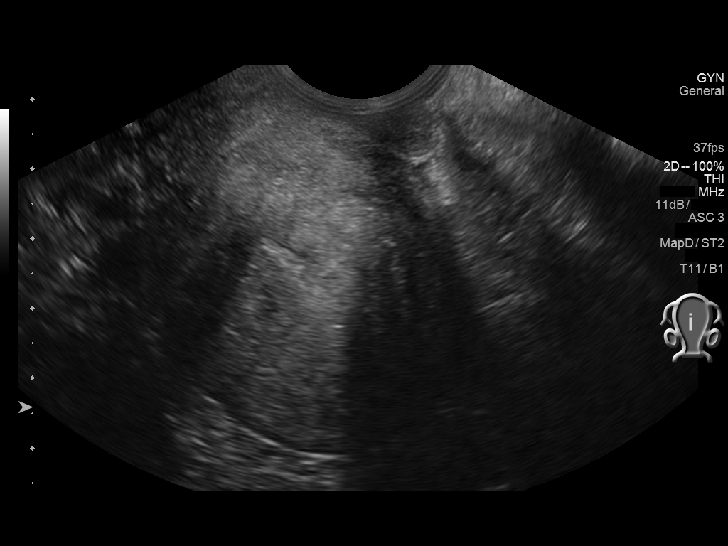
[im 51/82]
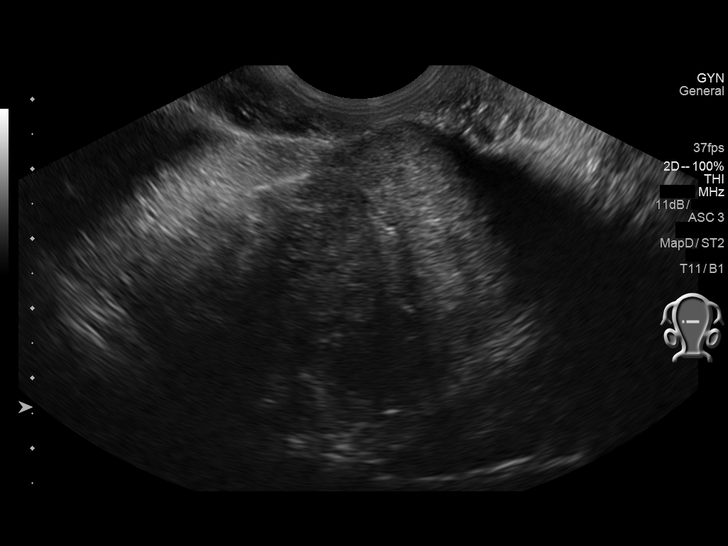
[im 55/82]
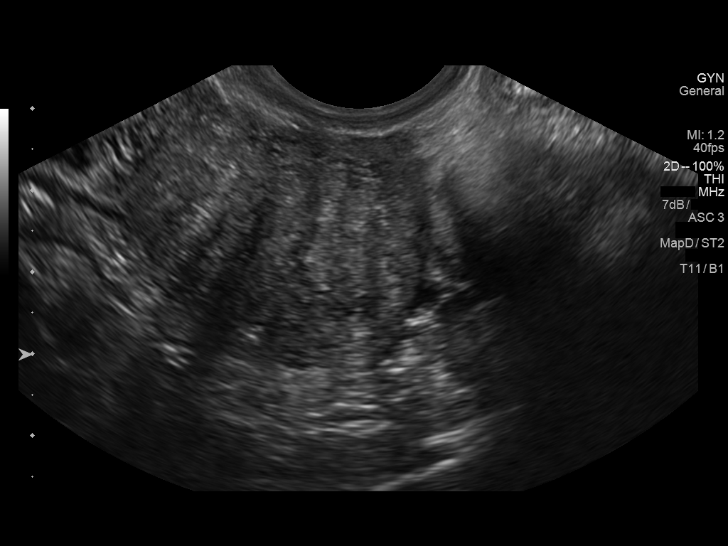
[im 61/82]
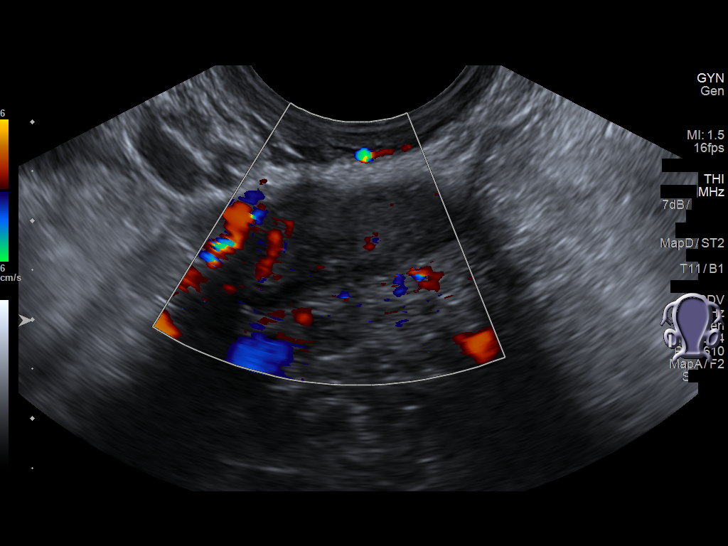
[im 68/82]
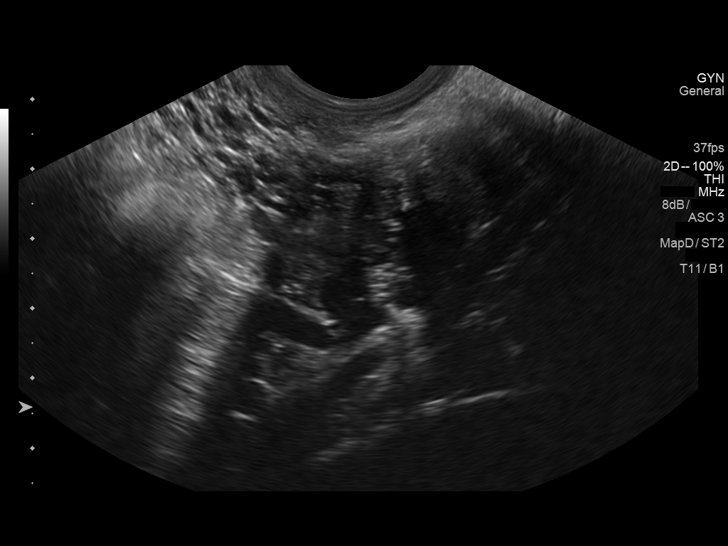
[im 75/82]
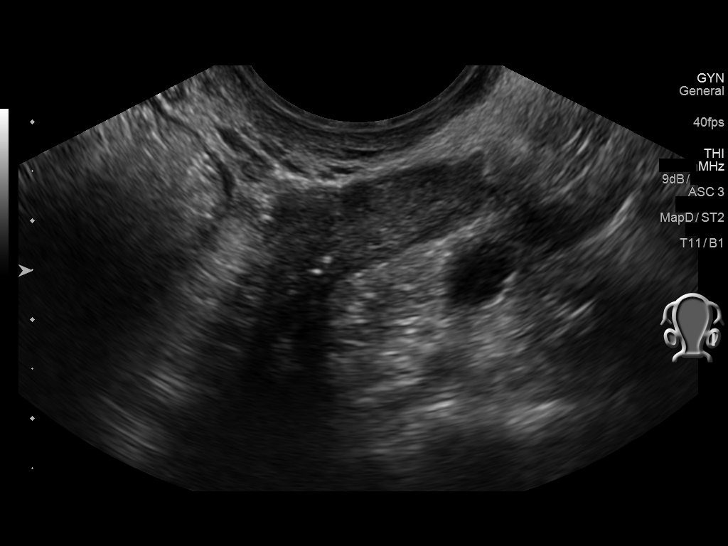
[im 82/82]
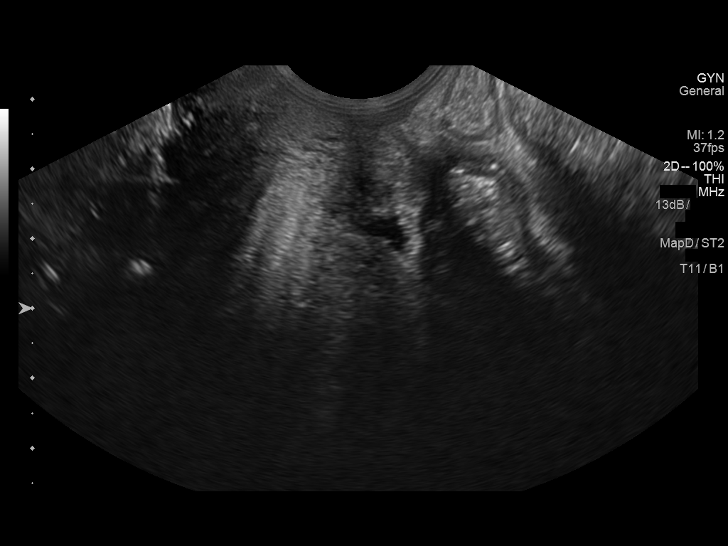

[14 of 25 positions shown; findings below may reference images not displayed]

FINDINGS: Uterus

Measurements: 6.1 x 4.2 x 4.4 cm. Uterus is retroverted. No fibroids
or other mass visualized.

Endometrium

Thickness: 3.3 mm.  No focal abnormality visualized.

Right ovary

Measurements: 3 x 1.1 x 1.1 cm. Normal appearance/no adnexal mass.

Left ovary

Measurements: 2.8 x 0.9 x 1 cm. Normal appearance/no adnexal mass.

Other findings

Minimal free fluid in the pelvis.
IMPRESSION: Normal appearance of the uterus and ovaries. Normal endometrial
stripe thickness.

## 2016-05-31 ENCOUNTER — Ambulatory Visit
Admission: RE | Admit: 2016-05-31 | Discharge: 2016-05-31 | Disposition: A | Discharge status: Home / Self Care | Payer: BLUE CROSS/BLUE SHIELD | Source: Ambulatory Visit | Attending: Obstetrics & Gynecology | Admitting: Obstetrics & Gynecology

## 2016-05-31 DIAGNOSIS — Z1231 Encounter for screening mammogram for malignant neoplasm of breast: Secondary | ICD-10-CM | POA: Insufficient documentation

## 2017-05-18 ENCOUNTER — Ambulatory Visit: Payer: BLUE CROSS/BLUE SHIELD | Admitting: Family Medicine

## 2017-05-30 ENCOUNTER — Ambulatory Visit (INDEPENDENT_AMBULATORY_CARE_PROVIDER_SITE_OTHER): Payer: BLUE CROSS/BLUE SHIELD | Admitting: Family Medicine

## 2017-05-30 ENCOUNTER — Encounter: Payer: Self-pay | Admitting: Family Medicine

## 2017-05-30 VITALS — BP 137/78 | HR 85 | Temp 98.4°F | Resp 16 | Ht 62.0 in | Wt 149.0 lb

## 2017-05-30 DIAGNOSIS — Z1231 Encounter for screening mammogram for malignant neoplasm of breast: Secondary | ICD-10-CM

## 2017-05-30 DIAGNOSIS — Z1239 Encounter for other screening for malignant neoplasm of breast: Secondary | ICD-10-CM

## 2017-05-30 DIAGNOSIS — H8111 Benign paroxysmal vertigo, right ear: Secondary | ICD-10-CM | POA: Diagnosis not present

## 2017-05-30 DIAGNOSIS — Z1211 Encounter for screening for malignant neoplasm of colon: Secondary | ICD-10-CM

## 2017-05-30 DIAGNOSIS — L259 Unspecified contact dermatitis, unspecified cause: Secondary | ICD-10-CM

## 2017-05-30 DIAGNOSIS — J302 Other seasonal allergic rhinitis: Secondary | ICD-10-CM | POA: Diagnosis not present

## 2017-05-30 DIAGNOSIS — J4520 Mild intermittent asthma, uncomplicated: Secondary | ICD-10-CM

## 2017-05-30 DIAGNOSIS — Z7689 Persons encountering health services in other specified circumstances: Secondary | ICD-10-CM | POA: Diagnosis not present

## 2017-05-30 DIAGNOSIS — J45909 Unspecified asthma, uncomplicated: Secondary | ICD-10-CM | POA: Insufficient documentation

## 2017-05-30 MED ORDER — ALBUTEROL SULFATE HFA 108 (90 BASE) MCG/ACT IN AERS: 2.0000 | INHALATION_SPRAY | RESPIRATORY_TRACT | 3 refills | Status: DC | PRN

## 2017-05-30 NOTE — Assessment & Plan Note (Signed)
Suspected R-BPPV by history - No other significant neurological findings or focal deficits  Plan: 1. Handout given with Epley maneuver TID for 1-2 weeks until resolved - may stop meclizine if not helping Return criteria, if not improved consider vestibular PT referral

## 2017-05-30 NOTE — Progress Notes (Signed)
Subjective:    Patient ID: Monique Sanders, female    DOB: Nov 07, 1965, 52 y.o.   MRN: [id number]  Monique Sanders is a 52 y.o. female presenting on 05/30/2017 for Establish Care; Allergic Reaction (facial allergy itchy); and Asthma  [facility] coverage, now has insurance and here to establish with new PCP. She has also established with GYN locally, see below HPI.  HPI   GYN / Hormonal Contraception / Cervical Cancer Screening / Vitamin D deficiency [facility], she has changed providers, [facility], has upcoming apt for annual physical and pap smear. Has been treated with OCP, will need refill soon, tolerating well. She also was treated for Vitamin D Deficiency, almost finished 50k units for few weeks, last lab was normal. - will have 5/30 physical and pap  Asthma mild intermittent (cold induced) / Environmental Allergies Reports history of allergies and has asthma only with flares due to cold or URI induced with bronchospasm. Also has some trigger with warmer air at times. Or temperature changes. She has Albuterol PRN rarely uses, request refill now. Not on maintenance therapy. - Some exercise induced asthma component with limited exercise Recent episode after contact with some outdoor allergen, had redness on both cheeks and itching, no where else on body, has had similar episode before. 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 She was treated before with meclizine, never given Epley maneuver  Additional allergies - Has several med allergies to codeine, tylenol etc, see above list  Lifestyle / Overweight  BMI >27 Diet less grains and carbs, limited sweets, trying to improve  Health Maintenance:  Colon CA Screening: Never had colonoscopy. She is hesitant due to anesthesia risk with history of vertigo for her before related to anesthesia. Currently asymptomatic. No known family history of colon CA. Due for screening test - counseling on colonoscopy vs cologuard, she will consider cologuard testing, notify office if ready to order.  Breast CA Screening: Due for mammogram screening. Last mamm[facility] result negative (05/31/16) [facility]. No prior history abnormal mammogram. No known family history of breast cancer. Currently asymptomatic. Order placed as requested   Dep[facility] 05/30/2017  Decreased Interest 0  Down, Depressed, Hopeless 0  PHQ - 2 Score 0    Past Medical History:  Diagnosis Date  . Allergy   . Asthma   . Gallstones   . GERD (gastroesophageal reflux disease)    Past Surgical History:  Procedure Laterality Date  . CARPAL TUNNEL RELEASE    . CHOLECYSTECTOMY N/A 05/06/2015   Procedure: LAPAROSCOPIC CHOLECYSTECTOMY;  Surgeon: Leafy Roiego F Pabon, MD;  Location: [facility] ORS;  Service: General;  Laterality: N/A;  . ctr    . DIAGNOSTIC LAPAROSCOPY    . Ovarian tube  Left    Social History   Socioeconomic History  . Marital status: Married    Spouse name: Not on file  . Number of children: Not on file  . Years of education: 6 years  . Highest education level: Not on file  Occupational History  . Occupation: Dry Cleaning  Social Needs  . Financial resource strain:  Not on file  . Food insecurity:    Worry: Not on file    Inability: Not on file  . Transportation needs:    Medical: Not on file    Non-medical: Not on file  Tobacco Use  . Smoking status: Never Smoker  . Smokeless tobacco: Never Used  Substance and Sexual Activity  . Alcohol use: No  . Drug use: No  . Sexual activity: Not on file  Lifestyle  . Physical activity:    Days per week: Not  on file    Minutes per session: Not on file  . Stress: Not on file  Relationships  . Social connections:    Talks on phone: Not on file    Gets together: Not on file    Attends religious service: Not on file    Active member of club or organization: Not on file    Attends meetings of clubs or organizations: Not on file    Relationship status: Not on file  . Intimate partner violence:    Fear of current or ex partner: Not on file    Emotionally abused: Not on file    Physically abused: Not on file    Forced sexual activity: Not on file  Other Topics Concern  . Not on file  Social History Narrative  . Not on file   Family History  Problem Relation Age of Onset  . Alcohol abuse Father   . Cirrhosis Father    Current Outpatient Medications on File Prior to Visit  Medication Sig  . cetirizine (ZYRTEC) 10 MG tablet Take 10 mg by mouth daily.  . ferrous sulfate 325 (65 FE) MG EC tablet Take 325 mg by mouth 3 (three) times daily with meals.  Marland Kitchen ibuprofen (ADVIL,MOTRIN) 200 MG tablet Take 200 mg by mouth every 6 (six) hours as needed.  . Multiple Vitamin (MULTIVITAMIN) tablet Take 1 tablet by mouth daily.  . norethindrone-ethinyl estradiol (ORTHO-NOVUM 7/7/7, 28,) 0.5/0.75/1-35 MG-MCG tablet Take by mouth.  . Levonorgestrel-Ethinyl Estradiol (AMETHIA,CAMRESE) 0.1-0.02 & 0.01 MG tablet Take 1 tablet by mouth daily.  . Vitamin D, Ergocalciferol, (DRISDOL) 50000 units CAPS capsule TAKE 1 CAPSULE (50,000 UNITS TOTAL) BY MOUTH ONCE A WEEK.   No current facility-administered medications on file prior to visit.     Review of Systems  Constitutional: Negative for activity change, appetite change, chills, diaphoresis, fatigue and fever.  HENT: Negative for congestion and hearing loss.   Eyes: Negative for visual disturbance.  Respiratory: Negative for apnea, cough, chest tightness, shortness of breath and wheezing.   Cardiovascular: Negative for chest pain, palpitations and leg swelling.    Gastrointestinal: Negative for abdominal distention, abdominal pain, anal bleeding, blood in stool, constipation, diarrhea, nausea and vomiting.  Endocrine: Negative for cold intolerance.  Genitourinary: Negative for decreased urine volume, difficulty urinating, dysuria, frequency, hematuria, menstrual problem, urgency, vaginal bleeding, vaginal discharge and vaginal pain.  Musculoskeletal: Negative for arthralgias, back pain and neck pain.  Skin: Negative for rash.       Rash, itching facial - improved  Allergic/Immunologic: Positive for environmental allergies.  Neurological: Negative for dizziness, weakness, light-headedness, numbness and headaches.  Hematological: Negative for adenopathy.  Psychiatric/Behavioral: Negative for behavioral problems, dysphoric mood and sleep disturbance. The patient is not nervous/anxious.    Per HPI unless specifically indicated above     Objective:    BP 137/78   Pulse 85   Temp 98.4 F (36.9 C) (Oral)   Resp 16   Ht 5\' 2"  (  1.575 m)   Wt 149 lb (67.6 kg)   BMI 27.25 kg/m   Wt Readings from Last 3 Encounters:  05/30/17 149 lb (67.6 kg)  06/10/15 124 lb (56.2 kg)  06/03/15 121 lb (54.9 kg)    Physical Exam  Constitutional: She is oriented to person, place, and time. She appears well-developed and well-nourished. No distress.  Well-appearing, comfortable, cooperative  HENT:  Head: Normocephalic and atraumatic.  Mouth/Throat: Oropharynx is clear and moist.  Eyes: Conjunctivae are normal. Right eye exhibits no discharge. Left eye exhibits no discharge.  Neck: Normal range of motion. Neck supple. No thyromegaly present.  Cardiovascular: Normal rate, regular rhythm, normal heart sounds and intact distal pulses.  No murmur heard. Pulmonary/Chest: Effort normal and breath sounds normal. No respiratory distress. She has no wheezes. She has no rales.  Good air movement  Musculoskeletal: Normal range of motion. She exhibits no edema.   Lymphadenopathy:    She has no cervical adenopathy.  Neurological: She is alert and oriented to person, place, and time.  Skin: Skin is warm and dry. No rash (No significant facial rash on exam today. No erythema or hives.) noted. She is not diaphoretic. No erythema.  Psychiatric: She has a normal mood and affect. Her behavior is normal.  Well groomed, good eye contact, normal speech and thoughts  Nursing note and vitals reviewed.  Results for orders placed or performed during the hospital encounter of 05/25/15  CBC with Differential  Result Value Ref Range   WBC 7.8 3.6 - 11.0 K/uL   RBC 3.90 3.80 - 5.20 MIL/uL   Hemoglobin 11.8 (L) 12.0 - 16.0 g/dL   HCT 16.1 (L) 09.6 - 04.5 %   MCV 89.2 80.0 - 100.0 fL   MCH 30.4 26.0 - 34.0 pg   MCHC 34.1 32.0 - 36.0 g/dL   RDW 40.9 81.1 - 91.4 %   Platelets 221 150 - 440 K/uL   Neutrophils Relative % 77 %   Neutro Abs 6.1 1.4 - 6.5 K/uL   Lymphocytes Relative 14 %   Lymphs Abs 1.1 1.0 - 3.6 K/uL   Monocytes Relative 5 %   Monocytes Absolute 0.4 0.2 - 0.9 K/uL   Eosinophils Relative 3 %   Eosinophils Absolute 0.2 0 - 0.7 K/uL   Basophils Relative 1 %   Basophils Absolute 0.0 0 - 0.1 K/uL  Comprehensive metabolic panel  Result Value Ref Range   Sodium 135 135 - 145 mmol/L   Potassium 3.6 3.5 - 5.1 mmol/L   Chloride 107 101 - 111 mmol/L   CO2 23 22 - 32 mmol/L   Glucose, Bld 105 (H) 65 - 99 mg/dL   BUN 13 6 - 20 mg/dL   Creatinine, Ser 7.82 0.44 - 1.00 mg/dL   Calcium 8.6 (L) 8.9 - 10.3 mg/dL   Total Protein 7.2 6.5 - 8.1 g/dL   Albumin 4.0 3.5 - 5.0 g/dL   AST 21 15 - 41 U/L   ALT 28 14 - 54 U/L   Alkaline Phosphatase 55 38 - 126 U/L   Total Bilirubin 0.7 0.3 - 1.2 mg/dL   GFR calc non Af Amer >60 >60 mL/min   GFR calc Af Amer >60 >60 mL/min   Anion gap 5 5 - 15  Lipase, blood  Result Value Ref Range   Lipase 21 11 - 51 U/L  Troponin I  Result Value Ref Range   Troponin I <0.03 <0.031 ng/mL  Urinalysis complete, with  microscopic  Result  Value Ref Range   Color, Urine COLORLESS (A) YELLOW   APPearance CLEAR (A) CLEAR   Glucose, UA NEGATIVE NEGATIVE mg/dL   Bilirubin Urine NEGATIVE NEGATIVE   Ketones, ur NEGATIVE NEGATIVE mg/dL   Specific Gravity, Urine 1.003 (L) 1.005 - 1.030   Hgb urine dipstick 1+ (A) NEGATIVE   pH 7.0 5.0 - 8.0   Protein, ur NEGATIVE NEGATIVE mg/dL   Nitrite NEGATIVE NEGATIVE   Leukocytes, UA NEGATIVE NEGATIVE   RBC / HPF 0-5 0 - 5 RBC/hpf   WBC, UA 0-5 0 - 5 WBC/hpf   Bacteria, UA RARE (A) NONE SEEN   Squamous Epithelial / LPF 0-5 (A) NONE SEEN  Pregnancy, urine  Result Value Ref Range   Preg Test, Ur NEGATIVE NEGATIVE  Troponin I  Result Value Ref Range   Troponin I <0.03 <0.031 ng/mL  Pregnancy, urine POC  Result Value Ref Range   Preg Test, Ur NEGATIVE NEGATIVE      Assessment & Plan:   Problem List Items Addressed This Visit    Asthma - Primary    Stable currently w/o acute exacerbation Trigger URI cold and some weather temp changes Seems some exercise induced as well Rarely using Albuterol PRN, request refill Discussed may benefit from maintenance in future such as Singulair also help allergies      Relevant Medications   albuterol (PROVENTIL HFA;VENTOLIN HFA) 108 (90 Base) MCG/ACT inhaler   BPPV (benign paroxysmal positional vertigo), right    Suspected R-BPPV by history - No other significant neurological findings or focal deficits  Plan: 1. Handout given with Epley maneuver TID for 1-2 weeks until resolved - may stop meclizine if not helping Return criteria, if not improved consider vestibular PT referral      Seasonal allergies    Relatively stable, recent flare Continues on chronic Zyrtec 10mg  daily Discussed benefit of consider adding Flonase and Singulair in future given asthma history       Other Visit Diagnoses    Screening for breast cancer       Relevant Orders   MM DIGITAL SCREENING BILATERAL   Encounter to establish care with new  doctor     Will review outside records available    Contact dermatitis, unspecified contact dermatitis type, unspecified trigger     Seems to be resolving, uncertain exact etiology seems atopic history Trial hydrocortisone low potency OTC BID 1 week caution hypopigmentation Add OTC topical benadryl if need Continue ZYrtec daily Future if need can add Hydroxyzine vs Prednisone burst if still refractory    Screening for colon cancer     Due for routine colon cancer screening. Never had colonoscopy (not interested she is concerned about anesthesia), no family history colon cancer. - Discussion today about recommendations for either Colonoscopy or Cologuard screening, benefits and risks of screening, interested in Cologuard, understands that if positive then recommendation is for diagnostic colonoscopy to follow-up.  - Patient advised to contact insurance first to learn cost, will notify us when ready for [place] to order Cologuard       Meds ordered this encounter  Medications  . albuterol (PROVENTIL HFA;VENTOLIN HFA) 108 (90 Base) MCG/ACT inhaler    Sig: Inhale 2 puffs into the lungs every 4 (four) hours as needed for wheezing or shortness of breath.    Dispense:  1 Inhaler    Refill:  3    Follow up plan: Return in about 6 months (around 11/29/2017) for 6 mo follow-up / Vertigo / Asthma/Allergy / may  need labs.  Saralyn Pilar, DO [facility] 05/30/2017, 11:56 PM

## 2017-05-30 NOTE — Assessment & Plan Note (Signed)
Stable currently w/o acute exacerbation Trigger URI cold and some weather temp changes Seems some exercise induced as well Rarely using Albuterol PRN, request refill Discussed may benefit from maintenance in future such as Singulair also help allergies

## 2017-05-30 NOTE — Patient Instructions (Addendum)
Thank you for coming to the office today.  For Vitamin D  After you finish current Vitamin D  Recommend OTC Vitamin D3 1,000 unit capsule daily to maintain  ---------------------------  For facial itching  Continue Cetirizine 59m daily  May continue topical steroid  Also can try OTC Benadryl Cream for anti itch  Can get worse due to a variety of factors (excessive dry skin from bathing/showering, soaps, cold weather / indoor heaters, outdoor exposures).  Use the topical steroid creams twice a day for up to 1 week, maximum duration of use per one flare is 10 to 14 days, then STOP using it and allow skin to recover. Caution with over-use may cause lightening of the skin.   Hydrocortisone on face only.  Tips to reduce Eczema Flares: For baths/showers, limit bathing to every other day if you can (max 1 x daily)  Use a gentle, unscented soap and lukewarm water (hot water is most irritating to skin) Never scrub skin with too much pressure, this causes more irritation. Pat skin dry, then leave it slightly damp. DO NOT scrub it dry. Apply steroid cream to skin and rub in all the way, wait 15 min, then apply a daily moisturizer (Vaseline, Eucerin, Aveeno). Continue daily moisturizer every day of the year (even after flare is resolved) - If you have eczema on hands or dry hands, recommend wearing any type of gloves overnight (cloth, fabric, or even nitrile/latex) to improve effect of topical moisturizer  If develops redness, honey colored crust oozing, drainage of pus, bleeding, or redness / swelling, pain, please return for re-evaluation, may have become infected after scratching.  ------------- If itching is not improved or worsening or rash - call office and we can try Hydroxyzine for itch and possibly short course of Prednisone steroid for this as well.  ------------------ In future, we may recommend Singulair for allergies and asthma also if interested   Colon Cancer Screening: -  For all adults age 51+routine colon cancer screening is highly recommended.     - Recent guidelines from [employer] starting age of 478- Early detection of colon cancer is important, because often there are no warning signs or symptoms, also if found early usually it can be cured. Late stage is hard to treat.  - If you are not interested in Colonoscopy screening (if done and normal you could be cleared for 5 to 10 years until next due), then Cologuard is an excellent alternative for screening test for Colon Cancer. It is highly sensitive for detecting DNA of colon cancer from even the earliest stages. Also, there is NO bowel prep required. - If Cologuard is NEGATIVE, then it is good for 3 years before next due - If Cologuard is POSITIVE, then it is strongly advised to get a Colonoscopy, which allows the GI doctor to locate the source of the cancer or polyp (even very early stage) and treat it by removing it. ------------------------- If you would like to proceed with Cologuard (stool DNA test) - FIRST, call your insurance company and tell them you want to check cost of Cologuard tell them CPT Code [phone](it may be completely covered and you could get for no cost, OR max cost without any coverage is about $600). Also, keep in mind if you do NOT open the kit, and decide not to do the test, you will NOT be charged, you should contact the company if you decide not to do the test. - If you  want to proceed, you can notify us (phone message, [place], or at next visit) and we will order it for you. The test kit will be delivered to you house within about 1 week. Follow instructions to collect sample, you may call the company for any help or questions, 24/7 telephone support at [phone].  Please schedule a Follow-up Appointment to: Return in about 6 months (around 11/29/2017) for 6 mo follow-up / Vertigo / Asthma/Allergy / may need labs.  If you have any other questions or  concerns, please feel free to call the office or send a message through Tensed. You may also schedule an earlier appointment if necessary.  Additionally, you may be receiving a survey about your experience at our office within a few days to 1 week by e-mail or mail. We value your feedback.  Nobie Putnam, DO [place]   -----  1. You have symptoms of Vertigo (Benign Paroxysmal Positional Vertigo) - This is commonly caused by inner ear fluid imbalance, sometimes can be worsened by allergies and sinus symptoms, otherwise it can occur randomly sometimes and we may never discover the exact cause. - To treat this, try the Epley Manuever (see diagrams/instructions below) at home up to 3 times a day for 1-2 weeks or until symptoms resolve - You may take Meclizine as needed up to 3 times a day for dizziness, this will not cure symptoms but may help. Caution may make you drowsy.  If you develop significant worsening episode with vertigo that does not improve and you get severe headache, loss of vision, arm or leg weakness, slurred speech, or other concerning symptoms please seek immediate medical attention at Emergency Department.  Please schedule a follow-up appointment with Dr Parks Ranger within 4 weeks if Vertigo not improving, and will consider Referral to Vestibular Rehab  See the next page for images describing the Epley Manuever.     ----------------------------------------------------------------------------------------------------------------------

## 2017-05-30 NOTE — Assessment & Plan Note (Signed)
Relatively stable, recent flare Continues on chronic Zyrtec 10mg  daily Discussed benefit of consider adding Flonase and Singulair in future given asthma history

## 2017-07-04 ENCOUNTER — Ambulatory Visit
Admission: RE | Admit: 2017-07-04 | Discharge: 2017-07-04 | Disposition: A | Discharge status: Home / Self Care | Payer: BLUE CROSS/BLUE SHIELD | Source: Ambulatory Visit | Attending: Family Medicine | Admitting: Family Medicine

## 2017-07-04 DIAGNOSIS — Z1231 Encounter for screening mammogram for malignant neoplasm of breast: Secondary | ICD-10-CM | POA: Diagnosis not present

## 2017-07-04 DIAGNOSIS — Z1239 Encounter for other screening for malignant neoplasm of breast: Secondary | ICD-10-CM

## 2017-07-12 LAB — HM PAP SMEAR

## 2017-08-30 LAB — HM COLONOSCOPY

## 2017-10-10 ENCOUNTER — Encounter: Payer: Self-pay | Admitting: Family Medicine

## 2017-10-10 ENCOUNTER — Ambulatory Visit: Payer: BLUE CROSS/BLUE SHIELD | Admitting: Family Medicine

## 2017-10-10 ENCOUNTER — Other Ambulatory Visit: Payer: Self-pay | Admitting: Family Medicine

## 2017-10-10 VITALS — BP 119/78 | HR 79 | Temp 98.2°F | Resp 16 | Ht 62.0 in | Wt 142.0 lb

## 2017-10-10 DIAGNOSIS — J302 Other seasonal allergic rhinitis: Secondary | ICD-10-CM

## 2017-10-10 DIAGNOSIS — S0083XA Contusion of other part of head, initial encounter: Secondary | ICD-10-CM | POA: Diagnosis not present

## 2017-10-10 MED ORDER — CETIRIZINE HCL 10 MG PO TABS: 10.0000 mg | ORAL_TABLET | Freq: Every day | ORAL | 3 refills | Status: DC

## 2017-10-10 NOTE — Patient Instructions (Addendum)
Thank you for coming to the office today.  Facial Contusion or bruise due to injury today.  I think it will continue to heal gradually Bruising will likely shift down the face due to gravity, may take 1-2 weeks or to to resolve The swelling will linger but hopefully reduce in next few days Keep using ice packs as much as you can to help reduce swelling, avoid laying down flat  Be on the look out incase symptoms change or get worse, if you get any of the following: - If vision is reduced or blurry vision or loss of vision or pain with eye movement, or if swelling EXTENDS across eye or closes eye, may need to be seen again or can go to hospital ED for further evaluation with CT imaging scan   Ask [facility] to fax us the copy of the report for Colonoscopy or can you drop off a copy of the report.  Consider [facility] if you lose insurance for coverage  [facility]   Address: [address], [place], [place] [address] Hours: Tues 4:15PM-8PM // Weds 9am - 12pm // Magdalene Mollyhurs 1pm - 8pm (Closed other days) Phone: [phone]  Please schedule a Follow-up Appointment to: Return in about 1 week (around 10/17/2017), or if symptoms worsen or fail to improve, for contusion.  If you have any other questions or concerns, please feel free to call the office or send a message through MyChart. You may also schedule an earlier appointment if necessary.  Additionally, you may be receiving a survey about your experience at our office within a few days to 1 week by e-mail or mail. We value your feedback.  Saralyn PilarAlexander Jasani Dolney, DO [facility]

## 2017-10-10 NOTE — Progress Notes (Signed)
Subjective:    Patient ID: Monique Sanders, female    DOB: 08-12-1965, 52 y.o.   MRN: [id number]  Monique ChurnRosa A Duskin is a 52 y.o. female presenting on 10/10/2017 for Fall and Head Injury (onset today no dizzness or HA from fall but very nervous)  History provided by both patient and her daughter present.  HPI  Right Facial Swellig Contusion / Accidental Fall Reports she was at work today approx 1:30pm, they were using some soap and cleaning the floor and she slipped accidentally and she had a collision with a closed door, she had some initial pain, bruising and swelling on lateral edge of her R eye/face, she already took some ibuprofen earlier today with mild relief. Has not taken any since, used topical ice packs. - Admits still persistent swelling, using ice packs with some relief - Denies any reduced vision or loss of vision, confusion, syncope, nausea vomiting  [facility]9 10/10/2017 05/30/2017  Decreased Interest 0 0  Down, Depressed, Hopeless 0 0  PHQ - 2 Score 0 0    Social History   Tobacco Use  . Smoking status: Never Smoker  . Smokeless tobacco: Never Used  Substance Use Topics  . Alcohol use: No  . Drug use: No    Review of Systems Per HPI unless specifically indicated above     Objective:    BP 119/78   Pulse 79   Temp 98.2 F (36.8 C) (Oral)   Resp 16   Ht 5\' 2"  (1.575 m)   Wt 142 lb (64.4 kg)   BMI 25.97 kg/m   Wt Readings from Last 3 Encounters:  10/10/17 142 lb (64.4 kg)  05/30/17 149 lb (67.6 kg)  06/10/15 124 lb (56.2 kg)    Physical Exam  Constitutional: She is oriented to person, place, and time. She appears well-developed and well-nourished. No distress.  Well-appearing, comfortable, cooperative  HENT:  Head: Normocephalic.  Mouth/Throat: Oropharynx is clear and moist.  Right upper facial region lateral to orbit/eye with localized 1-2 cm area of mild ecchymosis and soft tissue swelling slightly extends on to lateral edge of  upper eyelid but not involving lower eyelid, not impeding eye movement. Mild tender. No acute bony tenderness or deformity. No other facial injury or laceration or erythema or ecchymosis.  Bilateral ear canals normal, TMs normal, no mastoid tenderness.  Eyes: Pupils are equal, round, and reactive to light. Conjunctivae and EOM are normal. Right eye exhibits no discharge. Left eye exhibits no discharge.  Limited fundoscopic exam is normal bilateral  Neck: Normal range of motion. Neck supple. No thyromegaly present.  Cardiovascular: Normal rate, regular rhythm, normal heart sounds and intact distal pulses.  No murmur heard. Pulmonary/Chest: Effort normal and breath sounds normal. No respiratory distress. She has no wheezes. She has no rales.  Musculoskeletal: Normal range of motion. She exhibits no edema.  Distal strength intact bilateral upper ext  Lymphadenopathy:    She has no cervical adenopathy.  Neurological: She is alert and oriented to person, place, and time.  Distal sensation intact  Skin: Skin is warm and dry. No rash noted. She is not diaphoretic. No erythema.  Psychiatric: She has a normal mood and affect. Her behavior is normal.  Well groomed, good eye contact, normal speech and thoughts  Nursing note and vitals reviewed.         Assessment & Plan:   Problem List Items Addressed This Visit    None    Visit Diagnoses  Contusion of face, initial encounter    -  Primary     Clinically with mild to moderate accidental facial trauma from fall forward collision w/ door while at work. Localized symptoms R facial/periorbital region ecchymosis. Seems mild to moderate without obvious extension into orbital region. No exquisite bony tenderness to suggest facial fracture  Plan Recommend surveillance for change in symptoms  Ice packs frequently, avoid laying down flat - stay somewhat elevated to help reduce edema NSAID PRN Defer imaging at this time Work note Strict return  precautions if not improving or change in symptoms  No orders of the defined types were placed in this encounter.   Follow up plan: Return in about 1 week (around 10/17/2017), or if symptoms worsen or fail to improve, for contusion.  Saralyn Pilar, DO [facility] 10/10/2017, 5:21 PM

## 2017-11-30 ENCOUNTER — Encounter: Payer: Self-pay | Admitting: Family Medicine

## 2017-11-30 ENCOUNTER — Ambulatory Visit: Payer: BLUE CROSS/BLUE SHIELD | Admitting: Family Medicine

## 2017-12-07 IMAGING — RF DG UGI W/ KUB
2 series · 15 of 17 positions shown · non-contrast
Comparison: None.

CLINICAL DATA: Pain.

EXAM:
UPPER GI SERIES WITH KUB
TECHNIQUE: After obtaining a scout radiograph a routine upper GI series was
performed using thin and high density barium.
FLUOROSCOPY TIME:  Radiation Exposure Index (as provided by the
fluoroscopic device): 24.1 mGy

[Series 1: t abdomen supine · 0.14mm/px · 1 of 1 slices shown]
[im 1/1]
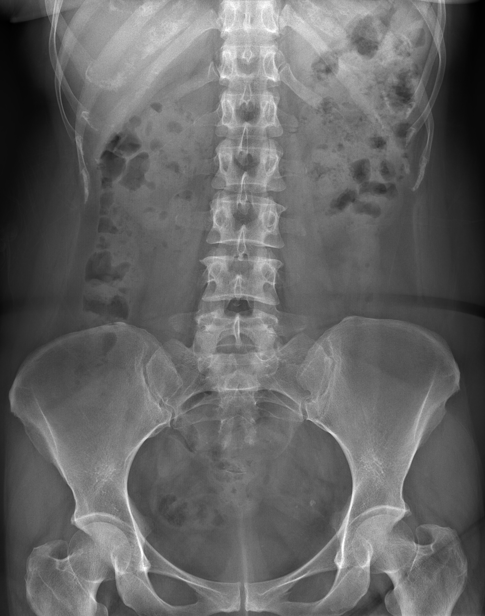

[Series 2: fluoro_barium singleshot_bw · 0.19mm/px · 14 of 16 slices shown]
[im 1/16]
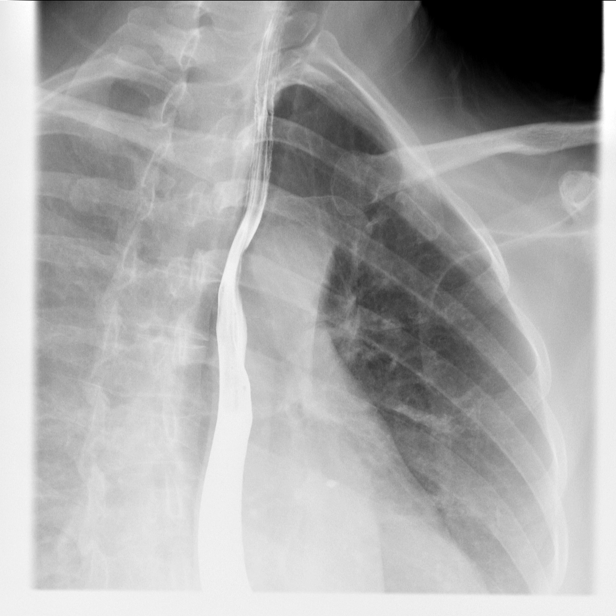
[im 2/16]
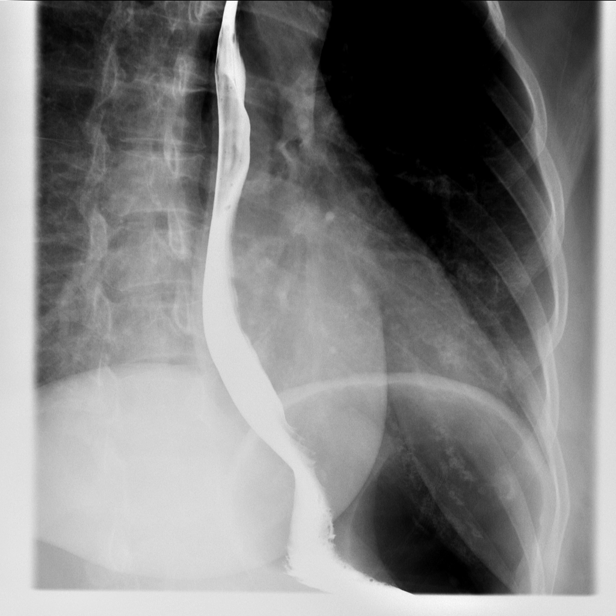
[im 3/16]
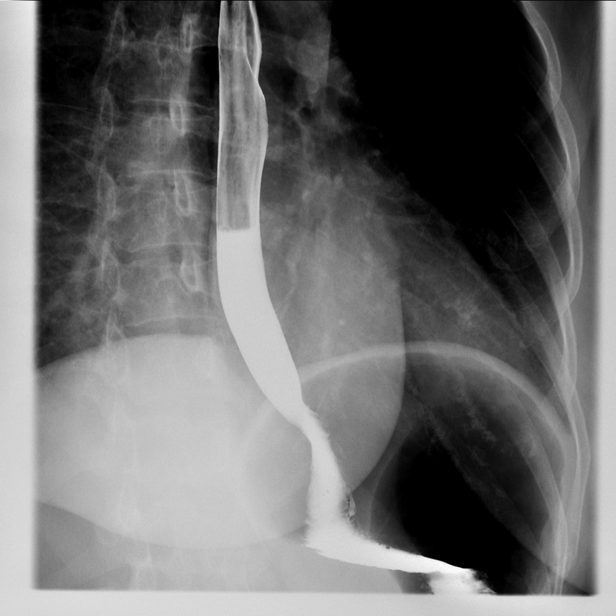
[im 5/16]
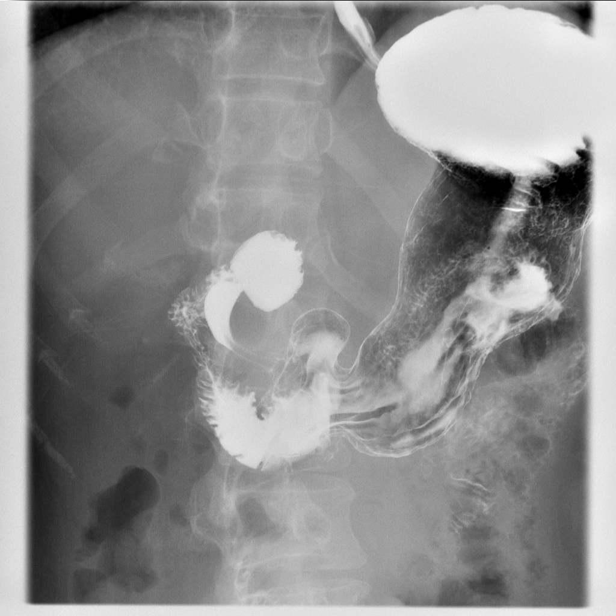
[im 6/16]
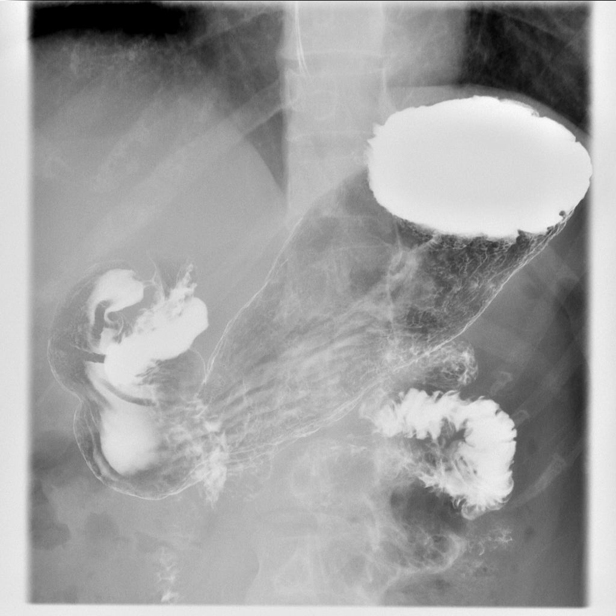
[im 7/16]
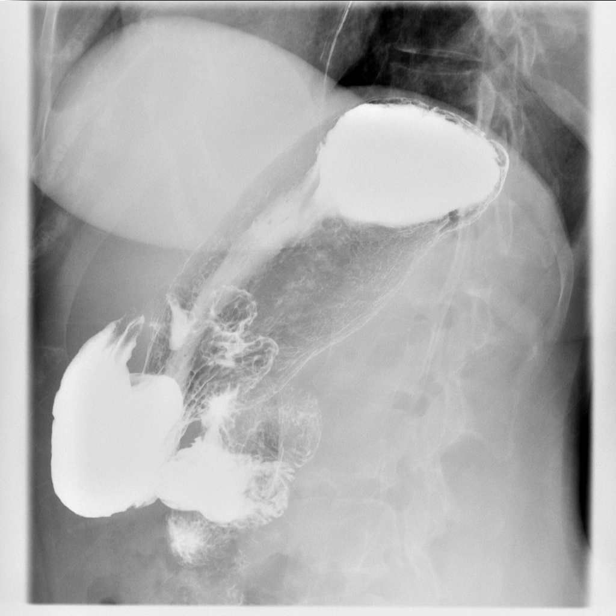
[im 8/16]
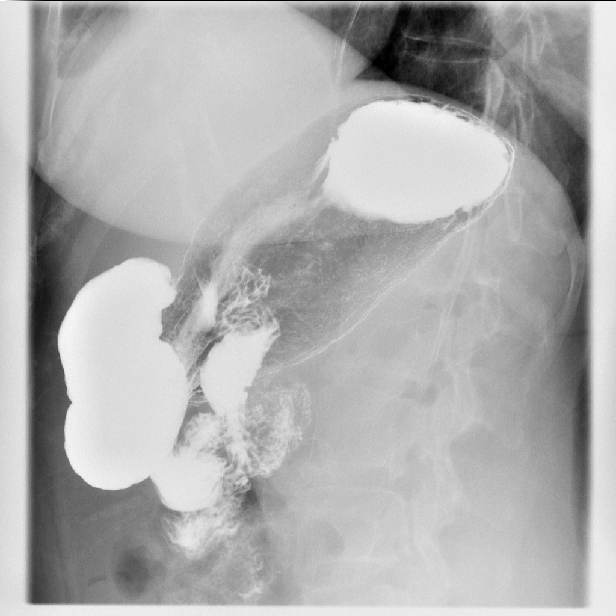
[im 9/16]
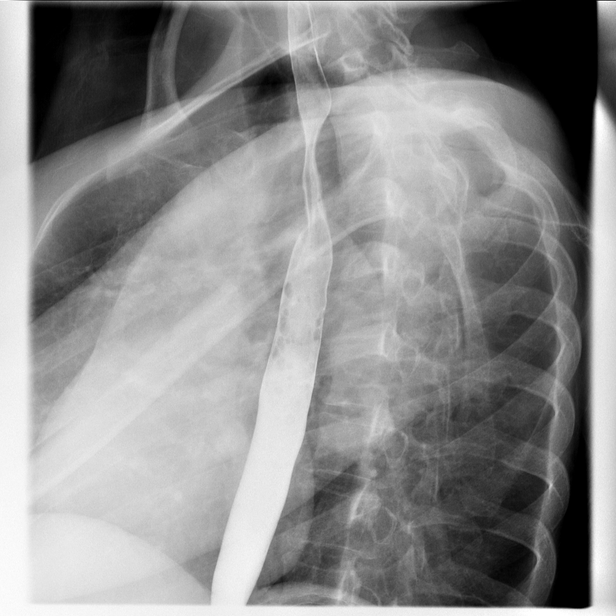
[im 10/16]
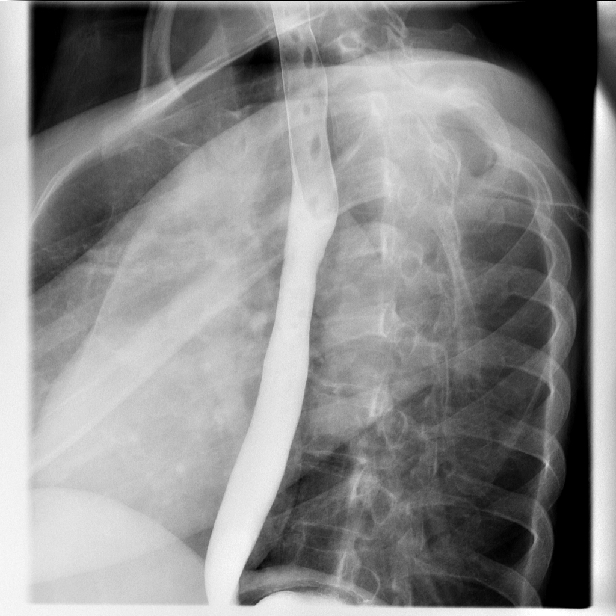
[im 11/16]
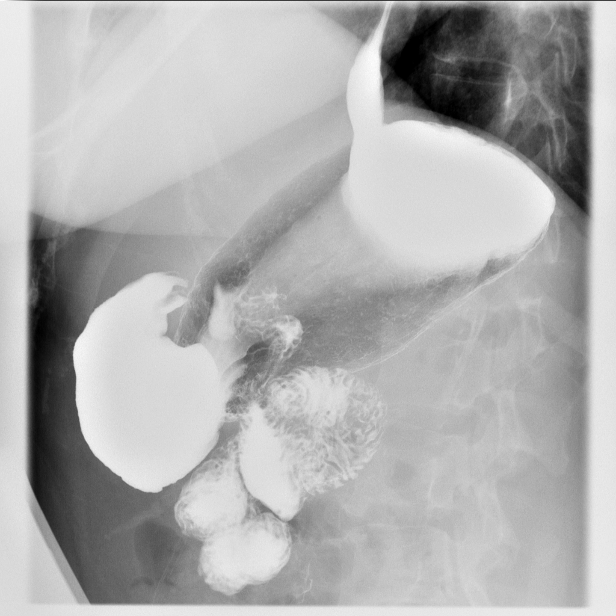
[im 13/16]
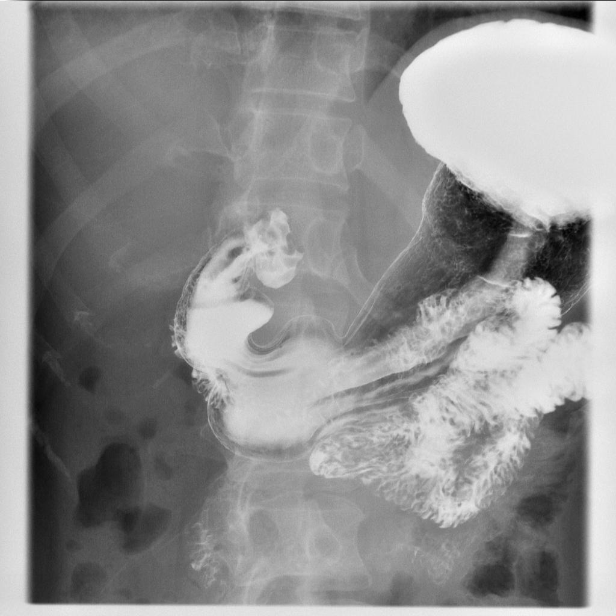
[im 14/16]
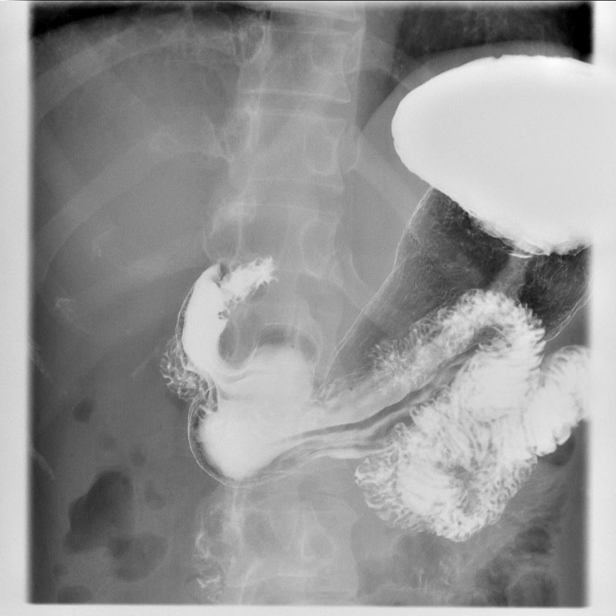
[im 15/16]
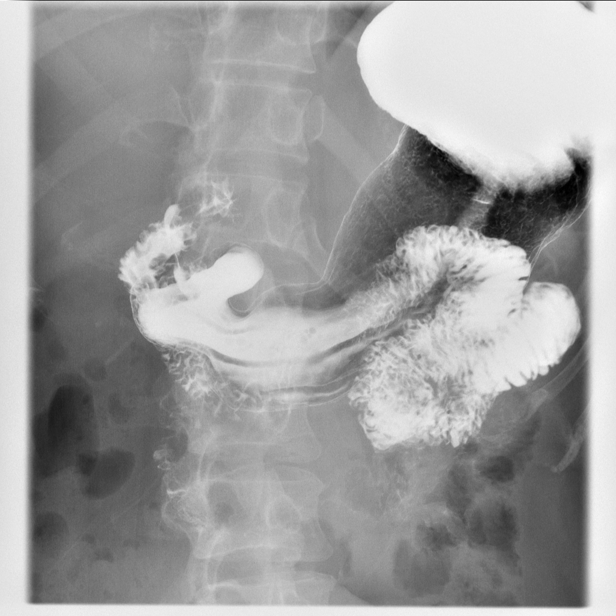
[im 16/16]
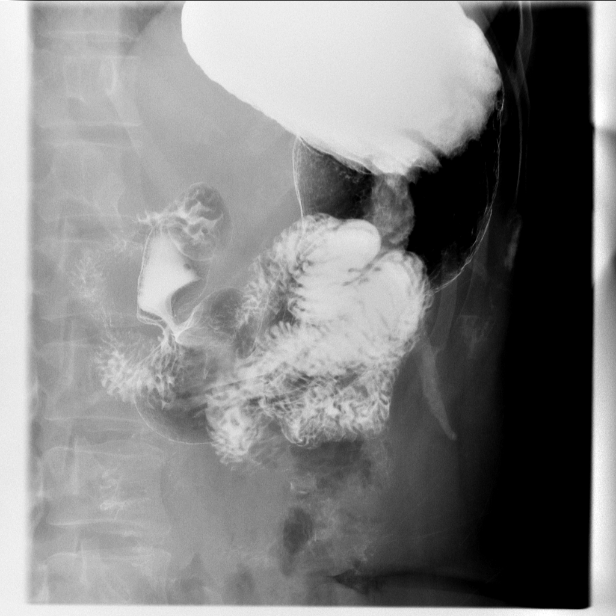

[15 of 17 positions shown; findings below may reference images not displayed]

FINDINGS: Esophagus and stomach are normal. Mild duodenum bulb fold
thickening. Mild duodenitis cannot be excluded. C-loop normal. No
gastroesophageal reflux.
IMPRESSION: Mild duodenum fold thickening. Duodenitis cannot be excluded. Exam
otherwise negative.

## 2017-12-12 ENCOUNTER — Encounter: Payer: Self-pay | Admitting: Family Medicine

## 2018-04-30 ENCOUNTER — Other Ambulatory Visit: Payer: Self-pay

## 2018-04-30 ENCOUNTER — Other Ambulatory Visit (HOSPITAL_COMMUNITY)
Admission: RE | Admit: 2018-04-30 | Discharge: 2018-04-30 | Disposition: A | Discharge status: Home / Self Care | Payer: BLUE CROSS/BLUE SHIELD | Source: Ambulatory Visit | Attending: Certified Nurse Midwife | Admitting: Certified Nurse Midwife

## 2018-04-30 ENCOUNTER — Ambulatory Visit (INDEPENDENT_AMBULATORY_CARE_PROVIDER_SITE_OTHER): Payer: BLUE CROSS/BLUE SHIELD | Admitting: Certified Nurse Midwife

## 2018-04-30 ENCOUNTER — Encounter: Payer: Self-pay | Admitting: Certified Nurse Midwife

## 2018-04-30 VITALS — BP 128/63 | HR 69 | Ht 62.0 in | Wt 142.4 lb

## 2018-04-30 DIAGNOSIS — Z1239 Encounter for other screening for malignant neoplasm of breast: Secondary | ICD-10-CM | POA: Diagnosis not present

## 2018-04-30 DIAGNOSIS — Z124 Encounter for screening for malignant neoplasm of cervix: Secondary | ICD-10-CM | POA: Diagnosis not present

## 2018-04-30 DIAGNOSIS — Z01419 Encounter for gynecological examination (general) (routine) without abnormal findings: Secondary | ICD-10-CM | POA: Insufficient documentation

## 2018-04-30 DIAGNOSIS — Z8639 Personal history of other endocrine, nutritional and metabolic disease: Secondary | ICD-10-CM

## 2018-04-30 MED ORDER — NORETHIN-ETH ESTRAD TRIPHASIC 0.5/0.75/1-35 MG-MCG PO TABS: 1.0000 | ORAL_TABLET | Freq: Every day | ORAL | 4 refills | Status: DC

## 2018-04-30 NOTE — Patient Instructions (Signed)
Preventive Care 40-64 Years, Female Preventive care refers to lifestyle choices and visits with your health care provider that can promote health and wellness. What does preventive care include?   A yearly physical exam. This is also called an annual well check.  Dental exams once or twice a year.  Routine eye exams. Ask your health care provider how often you should have your eyes checked.  Personal lifestyle choices, including: ? Daily care of your teeth and gums. ? Regular physical activity. ? Eating a healthy diet. ? Avoiding tobacco and drug use. ? Limiting alcohol use. ? Practicing safe sex. ? Taking low-dose aspirin daily starting at age 50. ? Taking vitamin and mineral supplements as recommended by your health care provider. What happens during an annual well check? The services and screenings done by your health care provider during your annual well check will depend on your age, overall health, lifestyle risk factors, and family history of disease. Counseling Your health care provider may ask you questions about your:  Alcohol use.  Tobacco use.  Drug use.  Emotional well-being.  Home and relationship well-being.  Sexual activity.  Eating habits.  Work and work environment.  Method of birth control.  Menstrual cycle.  Pregnancy history. Screening You may have the following tests or measurements:  Height, weight, and BMI.  Blood pressure.  Lipid and cholesterol levels. These may be checked every 5 years, or more frequently if you are over 50 years old.  Skin check.  Lung cancer screening. You may have this screening every year starting at age 55 if you have a 30-pack-year history of smoking and currently smoke or have quit within the past 15 years.  Colorectal cancer screening. All adults should have this screening starting at age 50 and continuing until age 75. Your health care provider may recommend screening at age 45. You will have tests every  1-10 years, depending on your results and the type of screening test. People at increased risk should start screening at an earlier age. Screening tests may include: ? Guaiac-based fecal occult blood testing. ? Fecal immunochemical test (FIT). ? Stool DNA test. ? Virtual colonoscopy. ? Sigmoidoscopy. During this test, a flexible tube with a tiny camera (sigmoidoscope) is used to examine your rectum and lower colon. The sigmoidoscope is inserted through your anus into your rectum and lower colon. ? Colonoscopy. During this test, a long, thin, flexible tube with a tiny camera (colonoscope) is used to examine your entire colon and rectum.  Hepatitis C blood test.  Hepatitis B blood test.  Sexually transmitted disease (STD) testing.  Diabetes screening. This is done by checking your blood sugar (glucose) after you have not eaten for a while (fasting). You may have this done every 1-3 years.  Mammogram. This may be done every 1-2 years. Talk to your health care provider about when you should start having regular mammograms. This may depend on whether you have a family history of breast cancer.  BRCA-related cancer screening. This may be done if you have a family history of breast, ovarian, tubal, or peritoneal cancers.  Pelvic exam and Pap test. This may be done every 3 years starting at age 21. Starting at age 30, this may be done every 5 years if you have a Pap test in combination with an HPV test.  Bone density scan. This is done to screen for osteoporosis. You may have this scan if you are at high risk for osteoporosis. Discuss your test results, treatment options,   and if necessary, the need for more tests with your health care provider. Vaccines Your health care provider may recommend certain vaccines, such as:  Influenza vaccine. This is recommended every year.  Tetanus, diphtheria, and acellular pertussis (Tdap, Td) vaccine. You may need a Td booster every 10 years.  Varicella  vaccine. You may need this if you have not been vaccinated.  Zoster vaccine. You may need this after age 38.  Measles, mumps, and rubella (MMR) vaccine. You may need at least one dose of MMR if you were born in 1957 or later. You may also need a second dose.  Pneumococcal 13-valent conjugate (PCV13) vaccine. You may need this if you have certain conditions and were not previously vaccinated.  Pneumococcal polysaccharide (PPSV23) vaccine. You may need one or two doses if you smoke cigarettes or if you have certain conditions.  Meningococcal vaccine. You may need this if you have certain conditions.  Hepatitis A vaccine. You may need this if you have certain conditions or if you travel or work in places where you may be exposed to hepatitis A.  Hepatitis B vaccine. You may need this if you have certain conditions or if you travel or work in places where you may be exposed to hepatitis B.  Haemophilus influenzae type b (Hib) vaccine. You may need this if you have certain conditions. Talk to your health care provider about which screenings and vaccines you need and how often you need them. This information is not intended to replace advice given to you by your health care provider. Make sure you discuss any questions you have with your health care provider. Document Released: 02/26/2015 Document Revised: 03/22/2017 Document Reviewed: 12/01/2014 Elsevier Interactive Patient Education  2019 [employer].

## 2018-04-30 NOTE — Progress Notes (Signed)
ANNUAL PREVENTATIVE CARE GYN  ENCOUNTER NOTE  Subjective:       Monique Sanders is a 53 y.o. G12P2002 female here for a routine annual gynecologic exam.  Current complaints: 1. Needs birth control refill 2. Desires pap smear  Denies difficulty breathing or respiratory distress, chest pain, abdominal pain, excessive vaginal bleeding, dysuria, and leg pain.    Gynecologic History  Patient's last menstrual period was 04/23/2000 (exact date). Period Cycle (Days): 28 Period Duration (Days): 4 Period Pattern: Regular Menstrual Flow: Light, Moderate, Heavy Menstrual Control: Panty liner, Thin pad, Maxi pad Dysmenorrhea: (!) Severe Dysmenorrhea Symptoms: Headache  Contraception: oral progesterone-only contraceptive  Last Pap: 06/2017. Results were: normal  Last mammogram: 06/2017. Results were: normal  Obstetric History  OB History  Gravida Para Term Preterm AB Living  2 2 2     2   SAB TAB Ectopic Multiple Live Births          2    # Outcome Date GA Lbr Len/2nd Weight Sex Delivery Anes PTL Lv  2 Term 1997    F Vag-Spont   LIV  1 Term 41    M Vag-Spont   LIV    Past Medical History:  Diagnosis Date  . Allergy   . Asthma   . Gallstones   . GERD (gastroesophageal reflux disease)     Past Surgical History:  Procedure Laterality Date  . CARPAL TUNNEL RELEASE    . CHOLECYSTECTOMY N/A 05/06/2015   Procedure: LAPAROSCOPIC CHOLECYSTECTOMY;  Surgeon: Leafy Ro, MD;  Location: [facility] ORS;  Service: General;  Laterality: N/A;  . ctr    . DIAGNOSTIC LAPAROSCOPY    . Ovarian tube  Left     Current Outpatient Medications on File Prior to Visit  Medication Sig Dispense Refill  . albuterol (PROVENTIL HFA;VENTOLIN HFA) 108 (90 Base) MCG/ACT inhaler Inhale 2 puffs into the lungs every 4 (four) hours as needed for wheezing or shortness of breath. 1 Inhaler 3  . cetirizine (ZYRTEC) 10 MG tablet Take 1 tablet (10 mg total) by mouth daily. 90 tablet 3  . ferrous sulfate 325 (65 FE)  MG EC tablet Take 325 mg by mouth 3 (three) times daily with meals.    Marland Kitchen ibuprofen (ADVIL,MOTRIN) 200 MG tablet Take 200 mg by mouth every 6 (six) hours as needed.    . Multiple Vitamin (MULTIVITAMIN) tablet Take 1 tablet by mouth daily.    . vitamin B-12 (CYANOCOBALAMIN) 50 MCG tablet Take by mouth.    . Vitamin D, Ergocalciferol, (DRISDOL) 50000 units CAPS capsule TAKE 1 CAPSULE (50,000 UNITS TOTAL) BY MOUTH ONCE A WEEK.  1   No current facility-administered medications on file prior to visit.     Allergies  Allergen Reactions  . Codeine Shortness Of Breath  . Pamprin [Apap-Pamabrom-Pyrilamine] Shortness Of Breath  . Tylenol [Acetaminophen] Shortness Of Breath  . Pamabrom     Social History   Socioeconomic History  . Marital status: Married    Spouse name: Not on file  . Number of children: Not on file  . Years of education: 6 years  . Highest education level: Not on file  Occupational History  . Occupation: Dry Cleaning  Social Needs  . Financial resource strain: Not on file  . Food insecurity:    Worry: Not on file    Inability: Not on file  . Transportation needs:    Medical: Not on file    Non-medical: Not on file  Tobacco Use  .  Smoking status: Never Smoker  . Smokeless tobacco: Never Used  Substance and Sexual Activity  . Alcohol use: No  . Drug use: No  . Sexual activity: Yes    Birth control/protection: Pill  Lifestyle  . Physical activity:    Days per week: Not on file    Minutes per session: Not on file  . Stress: Not on file  Relationships  . Social connections:    Talks on phone: Not on file    Gets together: Not on file    Attends religious service: Not on file    Active member of club or organization: Not on file    Attends meetings of clubs or organizations: Not on file    Relationship status: Not on file  . Intimate partner violence:    Fear of current or ex partner: Not on file    Emotionally abused: Not on file    Physically abused: Not  on file    Forced sexual activity: Not on file  Other Topics Concern  . Not on file  Social History Narrative  . Not on file    Family History  Problem Relation Age of Onset  . Alcohol abuse Father   . Cirrhosis Father     The following portions of the patient's history were reviewed and updated as appropriate: allergies, current medications, past family history, past medical history, past social history, past surgical history and problem list.  Review of Systems ROS Review of Systems - General ROS: negative for - chills, fatigue, fever, hot flashes, night sweats, weight gain or weight loss Psychological ROS: negative for - anxiety, decreased libido, depression, mood swings, physical abuse or sexual abuse Ophthalmic ROS: negative for - blurry vision, eye pain or loss of vision ENT ROS: negative for - headaches, hearing change, visual changes or vocal changes Allergy and Immunology ROS: negative for - hives, itchy/watery eyes or seasonal allergies Hematological and Lymphatic ROS: negative for - bleeding problems, bruising, swollen lymph nodes or weight loss Endocrine ROS: negative for - galactorrhea, hair pattern changes, hot flashes, malaise/lethargy, mood swings, palpitations, polydipsia/polyuria, skin changes, temperature intolerance or unexpected weight changes Breast ROS: negative for - new or changing breast lumps or nipple discharge Respiratory ROS: negative for - cough or shortness of breath Cardiovascular ROS: negative for - chest pain, irregular heartbeat, palpitations or shortness of breath Gastrointestinal ROS: no abdominal pain, change in bowel habits, or black or bloody stools Genito-Urinary ROS: no dysuria, trouble voiding, or hematuria Musculoskeletal ROS: negative for - joint pain or joint stiffness Neurological ROS: negative for - bowel and bladder control changes Dermatological ROS: negative for rash and skin lesion changes   Objective:   BP 128/63   Pulse 69    Ht 5\' 2"  (1.575 m)   Wt 142 lb 6 oz (64.6 kg)   LMP 04/23/2000 (Exact Date)   BMI 26.04 kg/m    CONSTITUTIONAL: Well-developed, well-nourished female in no acute distress.   PSYCHIATRIC: Normal mood and affect. Normal behavior. Normal judgment and thought content.  NEUROLGIC: Alert and oriented to person, place, and time. Normal muscle tone coordination. No cranial nerve deficit noted.  HENT:  Normocephalic, atraumatic, External right and left ear normal.   EYES: Conjunctivae and EOM are normal. Pupils are equal and round.   NECK: Normal range of motion, supple, no masses.  Normal thyroid.   SKIN: Skin is warm and dry. No rash noted. Not diaphoretic. No erythema. No pallor.  CARDIOVASCULAR: Normal heart rate noted, regular  rhythm, no murmur.  RESPIRATORY: Clear to auscultation bilaterally. Effort and breath sounds normal, no problems with respiration noted.  BREASTS: Symmetric in size. No masses, skin changes, nipple drainage, or lymphadenopathy.  ABDOMEN: Soft, normal bowel sounds, no distention noted.  No tenderness, rebound or guarding.   PELVIC:  External Genitalia: Normal  BUS: Normal  Vagina: Normal  Cervix: Normal  Uterus: Normal  Adnexa: Normal  MUSCULOSKELETAL: Normal range of motion. No tenderness.  No cyanosis, clubbing, or edema.  2+ distal pulses.  LYMPHATIC: No Axillary, Supraclavicular, or Inguinal Adenopathy.  Assessment:   Annual gynecologic examination 53 y.o.   Contraception: OCP (estrogen/progesterone)   Overweight   Problem List Items Addressed This Visit    None    Visit Diagnoses    Well woman exam    -  Primary   Relevant Orders   Cytology - PAP   MM 3D SCREEN BREAST BILATERAL   VITAMIN D 25 Hydroxy (Vit-D Deficiency, Fractures)   B12   Screening for cervical cancer       Relevant Orders   Cytology - PAP   Screening for breast cancer       Relevant Orders   MM 3D SCREEN BREAST BILATERAL   History of vitamin D deficiency        Relevant Orders   VITAMIN D 25 Hydroxy (Vit-D Deficiency, Fractures)   History of non anemic vitamin B12 deficiency       Relevant Orders   B12      Plan:   Pap: Pap Co Test  Mammogram: Ordered  Labs: See orders   Routine preventative health maintenance measures emphasized: Exercise/Diet/Weight control, Tobacco Warnings, Alcohol/Substance use risks and Stress Management; see AVS  Reviewed red flag symptoms and when to call  RTC x 1 year for ANNUAL EXAM or sooner if needed    Gunnar Bulla, CNM [facility], [facility] 04/30/18 3:29 PM

## 2018-05-01 LAB — COMPREHENSIVE METABOLIC PANEL
ALBUMIN: 4.6 g/dL (ref 3.8–4.9)
ALT: 23 IU/L (ref 0–32)
AST: 25 IU/L (ref 0–40)
Albumin/Globulin Ratio: 1.8 (ref 1.2–2.2)
Alkaline Phosphatase: 84 IU/L (ref 39–117)
BUN / CREAT RATIO: 17 (ref 9–23)
BUN: 12 mg/dL (ref 6–24)
Bilirubin Total: 1 mg/dL (ref 0.0–1.2)
CO2: 24 mmol/L (ref 20–29)
CREATININE: 0.72 mg/dL (ref 0.57–1.00)
Calcium: 9.5 mg/dL (ref 8.7–10.2)
Chloride: 101 mmol/L (ref 96–106)
GFR calc non Af Amer: 97 mL/min/{1.73_m2} (ref >59)
GFR, EST AFRICAN AMERICAN: 112 mL/min/{1.73_m2} (ref >59)
GLUCOSE: 105 mg/dL — AB (ref 65–99)
Globulin, Total: 2.6 g/dL (ref 1.5–4.5)
Potassium: 4 mmol/L (ref 3.5–5.2)
Sodium: 140 mmol/L (ref 134–144)
Total Protein: 7.2 g/dL (ref 6.0–8.5)

## 2018-05-01 LAB — CBC
HEMOGLOBIN: 11.8 g/dL (ref 11.1–15.9)
Hematocrit: 35.7 % (ref 34.0–46.6)
MCH: 30 pg (ref 26.6–33.0)
MCHC: 33.1 g/dL (ref 31.5–35.7)
MCV: 91 fL (ref 79–97)
Platelets: 249 10*3/uL (ref 150–450)
RBC: 3.93 x10E6/uL (ref 3.77–5.28)
RDW: 13.4 % (ref 11.7–15.4)
WBC: 7 10*3/uL (ref 3.4–10.8)

## 2018-05-01 LAB — VITAMIN D 25 HYDROXY (VIT D DEFICIENCY, FRACTURES): VIT D 25 HYDROXY: 50 ng/mL (ref 30.0–100.0)

## 2018-05-01 LAB — VITAMIN B12: VITAMIN B 12: 443 pg/mL (ref 232–1245)

## 2018-05-02 LAB — CYTOLOGY - PAP
Diagnosis: NEGATIVE
HPV (WINDOPATH): NOT DETECTED

## 2018-05-03 ENCOUNTER — Telehealth: Payer: Self-pay

## 2018-05-03 NOTE — Telephone Encounter (Signed)
Informed patient of negative test results per ML

## 2018-07-05 ENCOUNTER — Other Ambulatory Visit: Payer: Self-pay | Admitting: Family Medicine

## 2018-07-05 DIAGNOSIS — Z1231 Encounter for screening mammogram for malignant neoplasm of breast: Secondary | ICD-10-CM

## 2018-08-02 ENCOUNTER — Other Ambulatory Visit: Payer: Self-pay

## 2018-08-02 ENCOUNTER — Ambulatory Visit
Admission: RE | Admit: 2018-08-02 | Discharge: 2018-08-02 | Disposition: A | Discharge status: Home / Self Care | Payer: BLUE CROSS/BLUE SHIELD | Source: Ambulatory Visit | Attending: Family Medicine | Admitting: Family Medicine

## 2018-08-02 DIAGNOSIS — Z1231 Encounter for screening mammogram for malignant neoplasm of breast: Secondary | ICD-10-CM | POA: Insufficient documentation

## 2018-11-26 ENCOUNTER — Other Ambulatory Visit: Payer: Self-pay | Admitting: Family Medicine

## 2018-11-26 DIAGNOSIS — J302 Other seasonal allergic rhinitis: Secondary | ICD-10-CM

## 2019-05-01 ENCOUNTER — Ambulatory Visit (INDEPENDENT_AMBULATORY_CARE_PROVIDER_SITE_OTHER): Payer: BLUE CROSS/BLUE SHIELD | Admitting: Certified Nurse Midwife

## 2019-05-01 ENCOUNTER — Encounter: Payer: Self-pay | Admitting: Certified Nurse Midwife

## 2019-05-01 ENCOUNTER — Other Ambulatory Visit: Payer: Self-pay

## 2019-05-01 VITALS — BP 120/62 | HR 76 | Ht 62.0 in | Wt 146.1 lb

## 2019-05-01 DIAGNOSIS — Z1231 Encounter for screening mammogram for malignant neoplasm of breast: Secondary | ICD-10-CM

## 2019-05-01 DIAGNOSIS — Z0000 Encounter for general adult medical examination without abnormal findings: Secondary | ICD-10-CM

## 2019-05-01 DIAGNOSIS — Z01419 Encounter for gynecological examination (general) (routine) without abnormal findings: Secondary | ICD-10-CM

## 2019-05-01 DIAGNOSIS — Z8639 Personal history of other endocrine, nutritional and metabolic disease: Secondary | ICD-10-CM | POA: Diagnosis not present

## 2019-05-01 NOTE — Progress Notes (Signed)
Patient here for annual exam.  Patient c/o intermittent lower pelvic pain and abdominal bloating x10 years.  Patient requesting screening labs.

## 2019-05-01 NOTE — Patient Instructions (Addendum)
Dolor abdominal en los adultos Abdominal Pain, Adult El dolor de [place] (abdominal) puede tener muchas causas. [place] veces, el dolor de [place] no es peligroso. Muchos de [employer] de dolor de estmago pueden controlarse y tratarse en casa. Sin embargo, a Clinical cytogeneticist, Conservation officer, historic buildings de [place] es grave. El mdico intentar descubrir la causa del dolor de [place]. Siga estas instrucciones en su casa:  Medicamentos  [employer] de venta libre y los recetados solamente como se lo haya indicado el mdico.  No tome medicamentos que lo ayuden a Landscape architect (laxantes), salvo que el mdico se lo indique. Instrucciones generales  Est atento al dolor de estmago para Actuary cambio.  Beba suficiente lquido para Contractor pis (la orina) de color amarillo plido.  Concurra a todas las visitas de seguimiento como se lo haya indicado el mdico. Esto es importante. Comunquese con un mdico si:  El dolor de estmago cambia o [place].  No tiene apetito o baja de peso sin proponrselo.  Tiene dificultades para defecar (est estreido) o heces lquidas (diarrea) durante ms de 2 o 3das.  Siente dolor al orinar o defecar.  El dolor de estmago lo despierta de noche.  El dolor empeora con las comidas, despus de comer o con determinados alimentos.  Tiene vmitos y no puede retener nada de lo que ingiere.  Tiene fiebre.  Observa sangre en la orina. Solicite ayuda de inmediato si:  El dolor no desaparece en el tiempo indicado por el mdico.  No puede dejar de vomitar.  Siente dolor solamente en zonas especficas del abdomen, como el lado derecho o la parte inferior izquierda.  Tiene heces con sangre, de color negro o con aspecto alquitranado.  Tiene dolor muy intenso en el vientre, clicos o meteorismo.  Presenta signos de no tener suficientes lquidos o agua en el cuerpo (deshidratacin), por ejemplo: ? Elmon Else, muy escasa o falta de orina. ? Labios  agrietados. ? Sequedad de boca. ? Ojos hundidos. ? Somnolencia. ? Debilidad.  Tiene dificultad para respirar o Tourist information centre manager. Resumen  Muchos de [employer] de dolor de estmago pueden controlarse y tratarse en casa.  Est atento al dolor de estmago para Actuary cambio.  Tome los medicamentos de venta libre y los recetados solamente como se lo haya indicado el mdico.  Comunquese con un mdico si el dolor de estmago cambia o [place].  Busque ayuda de inmediato si tiene dolor muy intenso en el vientre, clicos o meteorismo. Esta informacin no tiene Marine scientist el consejo del mdico. Asegrese de hacerle al mdico cualquier pregunta que tenga. Document Revised: 08/07/2018 Document Reviewed: 08/07/2018 Elsevier Patient Education  [place] preventivos entre os 40-64 anos, [employer] Preventive Care 41-73 Years Old, Female Cuidados preventivos se referem a consultas com seu mdico e a escolhas de estilo de vida que podem promover sade e bem-estar. Isso inclui:  Um exame fsico anual. Tambm  chamado check-up anual.  Consultas regulares ao dentista e exames oftalmolgicos.  Imunizaes.  Triagem para certas doenas.  Escolhas saudveis de estilo de vida, como manter uma dieta saudvel, fazer exerccios regularmente, no usar drogas ou produtos que contenham nicotina e tabaco e limitar o consumo de lcool. O que posso esperar de minhas consultas preventivas? Exame fsico Seu mdico verificar sua:  Altura e peso. Isso pode ser usado para calcular o IMC (ndice de massa corporal), que diz se voc est com um peso saudvel.  Corky Sox cardaca  e presso arterial.  Pele, para ver se h manchas com alteraes. Aconselhamento Seu mdico tambm poder fazer perguntas sobre:  O uso de tabaco, lcool e drogas.  O bem-estar emocional.  O bem-estar em casa e nos relacionamentos.  A atividade sexual.  Os hbitos alimentares.  O  trabalho e o ambiente de trabalho.  O mtodo anticoncepcional.  O ciclo menstrual.  O histrico de gravidez. Quais imunizaes eu preciso tomar?  Vacina contra influenza (gripe)  Essa vacina  recomendada todos os anos. Vacina contra ttano, difteria e coqueluche (vacina Tdap)  Voc poder precisar de um reforo da Td a cada 10 anos. Vacina contra catapora (varicela)  Voc poder precisar dessa vacina se ainda no tiver sido vacinada. Vacina contra herpes zster (cobreiro)  Voc poder precisar dela depois dos 60 anos. Vacina contra sarampo, rubola e caxumba (MMR)  Voc poder precisar de pelo menos uma dose da MMR se tiver nascido em 1956 ou posteriormente. Voc tambm poder precisar de uma segunda dose. Vacina conjugada pneumoccica (PCV13)  Voc poder precisar dessa vacina se sofrer de certos quadros clnicos e ainda no tiver [employer]. Vacina pneumoccica polissacardica (PPSV23)  Voc poder precisar de uma ou duas doses caso fume cigarros ou sofra de certos quadros clnicos. Vacina meningoccica conjugada (MenACWY)  Voc poder precisar dela se sofrer de certos quadros clnicos. Vacina contra hepatite A  Voc poder precisar dessa vacina se apresentar certos quadros clnicos ou viajar para ou trabalhar em lugares nos quais pode ser exposto  hepatite A. Vacina contra hepatite B  Voc poder precisar dessa vacina se apresentar certos quadros clnicos ou viajar para ou trabalhar em lugares nos quais pode ser exposto  hepatite B. Vacina contra o Haemophilus influenzae tipo b (Hib)  Voc poder precisar dela se sofrer de certos quadros clnicos. Vacina contra o papilomavrus humano (HPV)  Se recomendado pelo seu mdico, voc poder precisar de trs doses ao longo de 6 meses. Voc pode tomar vacinas em doses individuais ou diversas vacinas juntas em uma nica dose (vacinas combinadas). Converse com seu mdico sobre os riscos e benefcios das vacinas  combinadas. Quais testes eu preciso fazer? Exames de sangue  Nveis de lipdeos e colesterol. Esses nveis podero ser verificados a cada 5 anos ou com maior frequncia caso voc tenha mais de 50 anos de idade.  Exame de hepatite C.  Exame de hepatite B. Exames preventivos  Exame preventivo de cncer de pulmo. Voc poder fazer esse exame preventivo comeando aos 55 anos se tiver histrico de consumo de 30 unidades mao-ano e fume atualmente ou tenha parado nos ltimos 15 anos.  Exame preventivo do cncer colorretal. Todos os adultos, a [employer] 50 at os 75 anos de idade, devem fazer esse exame preventivo. Seu mdico poder recomendar o exame preventivo aos 45 anos, caso voc tenha maior risco. Voc far exames a cada 1-10 anos, dependendo [employer] e do tipo de exame preventivo.  Exame preventivo de diabetes. Isso  feito verificando-se o acar no sangue (glicose) depois de voc no comer por algum tempo (jejum). Isso poder ser feito a cada 1-3 anos.  Mamografia. Poder ser feita a cada 1-2 anos. Converse com seu mdico sobre a frequncia com a Community education officer. Isso poder depender de voc ter ou no histrico familiar de cncer de mama.  Exame de preveno do cncer baseado em BRCA. Esse exame poder ser realizado caso voc tenha histrico de cncer de mama, de ovrio, das tubas uterinas ou de  peritnio.  Exame plvico e de Papanicolau. Isso poder ser [employer] a cada 3 anos a [employer] 21 anos de idade. Comeando aos 30 anos de idade, esses exames podero ser feitos a cada 5 anos caso voc realize um exame de Papanicolau junto com um teste de HPV. Outros exames  Exame para doenas sexualmente transmissveis (DSTs).  Exame da densidade ssea. Esse exame  feito para verificar a existncia de osteoporose. Voc poder fazer esse exame caso tenha risco elevado de osteoporose. Siga essas instrues em casa: Alimentos e bebidas  Mantenha uma  dieta que inclua frutas e verduras frescas, gros integrais, alimentos, fontes magras de protena e produtos lcteos pobres em gordura.  Tome suplementos de vitaminas e minerais de acordo com as recomendaes do seu mdico.  No consuma lcool se: ? Seu mdico disser para voc no beber. ? Estiver Gordy Clement, puder estar grvida ou planejar engravidar.  Se voc consome lcool: ? Limite seu consumo a 0-1 dose por dia. ? Observe quanto lcool sua bebida contm. Nos EUA, um drinque equivale a uma lata de cerveja de 12 oz (355 ml), uma taa de vinho de 5 oz (148 ml) ou uma dose de bebida destilada de 1 oz (44 ml). Estilo de vida  Faa o cuidado dirio dos seus dentes e gengivas.  Permanea ativa. Exercite-se por pelo menos 30 minutos em 5 dias da semana ou [place].  No use nenhum produto que contenha nicotina ou tabaco, como cigarros tradicionais, cigarros eletrnicos e fumo de Higher education careers adviser. Caso precise de ajuda para parar de fumar, fale com seu mdico.  Se voc for sexualmente ativa, faa sexo seguro. Use um preservativo ou outra forma de controle de natalidade (contracepo), para evitar a Occupational hygienist e DSTs (doenas sexualmente transmissveis).  Se orientado pelo seu mdico, tome aspirina em dose baixa diariamente a [employer] 50 anos de idade. O que vem a seguir?  Visite seu mdico uma vez por ano para uma consulta de check-up.  Pergunte ao seu mdico com que frequncia seus olhos e dentes devem ser examinados.  Mantenha todas assuas vacinas em dia. Estas informaes no se destinam a substituir as recomendaes de seu mdico. No deixe de discutir quaisquer dvidas com seu mdico. Document Revised: 10/26/2017 Document Reviewed: 10/26/2017 Elsevier Patient Education  2020 [employer].

## 2019-05-02 LAB — COMPREHENSIVE METABOLIC PANEL
ALT: 22 IU/L (ref 0–32)
AST: 15 IU/L (ref 0–40)
Albumin/Globulin Ratio: 1.7 (ref 1.2–2.2)
Albumin: 4.7 g/dL (ref 3.8–4.9)
Alkaline Phosphatase: 116 IU/L (ref 39–117)
BUN/Creatinine Ratio: 13 (ref 9–23)
BUN: 11 mg/dL (ref 6–24)
Bilirubin Total: 0.5 mg/dL (ref 0.0–1.2)
CO2: 23 mmol/L (ref 20–29)
Calcium: 9.9 mg/dL (ref 8.7–10.2)
Chloride: 100 mmol/L (ref 96–106)
Creatinine, Ser: 0.85 mg/dL (ref 0.57–1.00)
GFR calc Af Amer: 91 mL/min/{1.73_m2} (ref >59)
GFR calc non Af Amer: 79 mL/min/{1.73_m2} (ref >59)
Globulin, Total: 2.7 g/dL (ref 1.5–4.5)
Glucose: 99 mg/dL (ref 65–99)
Potassium: 4.2 mmol/L (ref 3.5–5.2)
Sodium: 139 mmol/L (ref 134–144)
Total Protein: 7.4 g/dL (ref 6.0–8.5)

## 2019-05-02 LAB — CBC
Hematocrit: 37.1 % (ref 34.0–46.6)
Hemoglobin: 12.5 g/dL (ref 11.1–15.9)
MCH: 30.9 pg (ref 26.6–33.0)
MCHC: 33.7 g/dL (ref 31.5–35.7)
MCV: 92 fL (ref 79–97)
Platelets: 336 10*3/uL (ref 150–450)
RBC: 4.04 x10E6/uL (ref 3.77–5.28)
RDW: 13 % (ref 11.7–15.4)
WBC: 9.3 10*3/uL (ref 3.4–10.8)

## 2019-05-02 LAB — VITAMIN D 25 HYDROXY (VIT D DEFICIENCY, FRACTURES): Vit D, 25-Hydroxy: 78.9 ng/mL (ref 30.0–100.0)

## 2019-05-02 LAB — B12 AND FOLATE PANEL
Folate: >20 ng/mL (ref >3.0)
Vitamin B-12: 992 pg/mL (ref 232–1245)

## 2019-05-02 NOTE — Progress Notes (Signed)
Please contact, all labs normal. Referral placed to [facility] and they will contact with appointment. Thanks, JML

## 2019-05-02 NOTE — Progress Notes (Signed)
ANNUAL PREVENTATIVE CARE GYN  ENCOUNTER NOTE  Subjective:       Monique Sanders is a 54 y.o. [id number] female here for a routine annual gynecologic exam.  Current complaints: 1. Request labs 2. Needs screening mammogram 3. Desires female PCP  Reports intermittent pelvic pain and abdominal bloating for the last 10 years.   Denies difficulty breathing or respiratory distress, chest pain, abdominal pain, dysuria, and leg pain or swelling.    Gynecologic History  Patient's last menstrual period was 04/22/2019 (exact date). Period Cycle (Days): 28 Period Duration (Days): 3 Period Pattern: Regular Menstrual Flow: Moderate, Light Menstrual Control: Maxi pad Dysmenorrhea: (!) Moderate Dysmenorrhea Symptoms: Cramping, Headache  Contraception: OCP (estrogen/progesterone), Cyclafem   Last Pap: 04/2018. Results were: Negative/Negative  Last mammogram: 07/2018. Results were: normal  Last colonoscopy:   Obstetric History  OB History  Gravida Para Term Preterm AB Living  2 2 2     2   SAB TAB Ectopic Multiple Live Births          2    # Outcome Date GA Lbr Len/2nd Weight Sex Delivery Anes PTL Lv  2 Term 05/28/95   8 lb 3 oz (3.714 kg) F Vag-Spont None  LIV  1 Term 09/09/91   7 lb 13 oz (3.544 kg) M Vag-Spont None  LIV    Past Medical History:  Diagnosis Date  . Allergy   . Asthma   . Gallstones   . GERD (gastroesophageal reflux disease)     Past Surgical History:  Procedure Laterality Date  . CARPAL TUNNEL RELEASE    . CHOLECYSTECTOMY N/A 05/06/2015   Procedure: LAPAROSCOPIC CHOLECYSTECTOMY;  Surgeon: 05/08/2015, MD;  Location: [facility] ORS;  Service: General;  Laterality: N/A;  . ctr    . DIAGNOSTIC LAPAROSCOPY    . Ovarian tube  Left     Current Outpatient Medications on File Prior to Visit  Medication Sig Dispense Refill  . albuterol (PROVENTIL HFA;VENTOLIN HFA) 108 (90 Base) MCG/ACT inhaler Inhale 2 puffs into the lungs every 4 (four) hours as needed for wheezing or  shortness of breath. 1 Inhaler 3  . cetirizine (ZYRTEC) 10 MG tablet TAKE 1 TABLET BY MOUTH EVERY DAY 90 tablet 3  . cholecalciferol (VITAMIN D3) 25 MCG (1000 UNIT) tablet Take 5,000 Units by mouth daily.    . ferrous sulfate 325 (65 FE) MG EC tablet Take 325 mg by mouth 3 (three) times daily with meals.    Leafy Ro ibuprofen (ADVIL,MOTRIN) 200 MG tablet Take 200 mg by mouth every 6 (six) hours as needed.    . Multiple Vitamin (MULTIVITAMIN) tablet Take 1 tablet by mouth daily.    . norethindrone-ethinyl estradiol (ORTHO-NOVUM 7/7/7, 28,) 0.5/0.75/1-35 MG-MCG tablet Take 1 tablet by mouth daily. 3 Package 4  . prednisoLONE acetate (PRED FORTE) 1 % ophthalmic suspension Place 1 drop into the right eye 4 (four) times daily as needed.    . vitamin B-12 (CYANOCOBALAMIN) 50 MCG tablet Take 50 mcg by mouth daily.      No current facility-administered medications on file prior to visit.    Allergies  Allergen Reactions  . Codeine Shortness Of Breath  . Pamprin [Apap-Pamabrom-Pyrilamine] Shortness Of Breath  . Tylenol [Acetaminophen] Shortness Of Breath  . Pamabrom     Social History   Socioeconomic History  . Marital status: Married    Spouse name: Not on file  . Number of children: Not on file  . Years of education: 6 years  .  Highest education level: Not on file  Occupational History  . Occupation: Dry Cleaning  Tobacco Use  . Smoking status: Never Smoker  . Smokeless tobacco: Never Used  Substance and Sexual Activity  . Alcohol use: No  . Drug use: No  . Sexual activity: Yes    Birth control/protection: Pill  Other Topics Concern  . Not on file  Social History Narrative  . Not on file   Social Determinants of Health   Financial Resource Strain:   . Difficulty of Paying Living Expenses:   Food Insecurity:   . Worried About Charity fundraiser in the Last Year:   . Arboriculturist in the Last Year:   Transportation Needs:   . Film/video editor (Medical):   Marland Kitchen Lack of  Transportation (Non-Medical):   Physical Activity:   . Days of Exercise per Week:   . Minutes of Exercise per Session:   Stress:   . Feeling of Stress :   Social Connections:   . Frequency of Communication with Friends and Family:   . Frequency of Social Gatherings with Friends and Family:   . Attends Religious Services:   . Active Member of Clubs or Organizations:   . Attends Archivist Meetings:   Marland Kitchen Marital Status:   Intimate Partner Violence:   . Fear of Current or Ex-Partner:   . Emotionally Abused:   Marland Kitchen Physically Abused:   . Sexually Abused:     Family History  Problem Relation Age of Onset  . Alcohol abuse Father   . Cirrhosis Father   . Breast cancer Neg Hx   . Ovarian cancer Neg Hx   . Colon cancer Neg Hx     The following portions of the patient's history were reviewed and updated as appropriate: allergies, current medications, past family history, past medical history, past social history, past surgical history and problem list.  Review of Systems  ROS negative except as noted. Information obtained from patient.    Objective:   BP 120/62   Pulse 76   Ht 5\' 2"  (1.575 m)   Wt 146 lb 1.6 oz (66.3 kg)   LMP 04/22/2019 (Exact Date)   BMI 26.72 kg/m    CONSTITUTIONAL: Well-developed, well-nourished female in no acute distress.   PSYCHIATRIC: Normal mood and affect. Normal behavior. Normal judgment and thought content.  F[place]ch: Alert and oriented to person, place, and time. Normal muscle tone coordination. No cranial nerve deficit noted.  HENT:  Normocephalic, atraumatic, External right and left ear normal.   EYES: Conjunctivae and EOM are normal. Pupils are equal and round.   NECK: Normal range of motion, supple, no masses.  Normal thyroid.   SKIN: Skin is warm and dry. No rash noted. Not diaphoretic. No erythema. No pallor.  CARDIOVASCULAR: Normal heart rate noted, regular rhythm, no murmur.  RESPIRATORY: Clear to auscultation  bilaterally. Effort and breath sounds normal, no problems with respiration noted.  BREASTS: Symmetric in size. No masses, skin changes, nipple drainage, or lymphadenopathy.  ABDOMEN: Soft, normal bowel sounds, no distention noted.  No tenderness, rebound or guarding.   PELVIC:  External Genitalia: Normal  Vagina: Normal  Cervix: Normal  Uterus: Normal  Adnexa: Normal   MUSCULOSKELETAL: Normal range of motion. No tenderness.  No cyanosis, clubbing, or edema.  2+ distal pulses.  LYMPHATIC: No Axillary, Supraclavicular, or Inguinal Adenopathy.  Assessment:   Annual gynecologic examination 54 y.o.   Contraception: OCP (estrogen/progesterone), Cyclafem   Overweight  Problem List Items Addressed This Visit    None    Visit Diagnoses    Well woman exam    -  Primary   Relevant Orders   MM 3D SCREEN BREAST BILATERAL   CBC (Completed)   Comprehensive metabolic panel (Completed)   VITAMIN D 25 Hydroxy (Vit-D Deficiency, Fractures) (Completed)   B12 and Folate Panel (Completed)   History of vitamin D deficiency       Relevant Orders   VITAMIN D 25 Hydroxy (Vit-D Deficiency, Fractures) (Completed)   History of non anemic vitamin B12 deficiency       Relevant Orders   CBC (Completed)   B12 and Folate Panel (Completed)   Encounter for screening mammogram for malignant neoplasm of breast       Relevant Orders   MM 3D SCREEN BREAST BILATERAL   Encounter for medical examination to establish care       Relevant Orders   Ambulatory referral to Family Practice      Plan:   Pap: Not needed  Mammogram: Ordered  Labs: See orders   Routine preventative health maintenance measures emphasized: Exercise/Diet/Weight control, Tobacco Warnings, Alcohol/Substance use risks and Stress Management; see AVS  Rx Cyclafem, see orders  [facility], see orders  Reviewed red flag symptoms and when to call  Return to Clinic - 1 Year or sooner if needed    Gunnar Bulla, CNM [facility], [facility]

## 2019-05-09 ENCOUNTER — Telehealth: Payer: Self-pay

## 2019-05-09 ENCOUNTER — Other Ambulatory Visit: Payer: Self-pay | Admitting: Family Medicine

## 2019-05-09 ENCOUNTER — Telehealth: Payer: Self-pay | Admitting: Certified Nurse Midwife

## 2019-05-09 DIAGNOSIS — J4520 Mild intermittent asthma, uncomplicated: Secondary | ICD-10-CM

## 2019-05-09 NOTE — Telephone Encounter (Signed)
Please contact patient. Which medication does she need? JML

## 2019-05-09 NOTE — Telephone Encounter (Signed)
Patient is requesting a 3 month supply of Proair.

## 2019-05-09 NOTE — Telephone Encounter (Signed)
Patient called in saying she needed a refill on her prescription for asthma. Told patient I would leave a message for provider.

## 2019-05-10 NOTE — Telephone Encounter (Signed)
It appears her PCP refill this medication on 05/09/2019. Does she still need a refill from [place]? JML

## 2019-05-12 ENCOUNTER — Other Ambulatory Visit: Payer: Self-pay

## 2019-05-12 ENCOUNTER — Telehealth: Payer: Self-pay

## 2019-05-12 DIAGNOSIS — J4520 Mild intermittent asthma, uncomplicated: Secondary | ICD-10-CM

## 2019-05-12 MED ORDER — ALBUTEROL SULFATE HFA 108 (90 BASE) MCG/ACT IN AERS
INHALATION_SPRAY | RESPIRATORY_TRACT | 0 refills | Status: AC
Start: 2019-05-12 — End: ?

## 2019-05-12 NOTE — Telephone Encounter (Signed)
Please send refill of Proair. Let her know that the referral was placed. Thanks JML.

## 2019-05-12 NOTE — Telephone Encounter (Signed)
Voicemail message left for patient- refills sent and referral placed.

## 2019-05-12 NOTE — Telephone Encounter (Signed)
Refills on Proair sent to pharmacy.

## 2019-06-30 ENCOUNTER — Other Ambulatory Visit: Payer: Self-pay | Admitting: Certified Nurse Midwife

## 2019-08-04 ENCOUNTER — Ambulatory Visit: Payer: BLUE CROSS/BLUE SHIELD | Attending: Certified Nurse Midwife

## 2019-08-04 ENCOUNTER — Other Ambulatory Visit: Payer: Self-pay

## 2019-08-04 ENCOUNTER — Telehealth: Payer: Self-pay | Admitting: Certified Nurse Midwife

## 2019-08-04 ENCOUNTER — Encounter (INDEPENDENT_AMBULATORY_CARE_PROVIDER_SITE_OTHER): Payer: Self-pay

## 2019-08-04 NOTE — Telephone Encounter (Signed)
Pt called in and stated that she needs her mammogram sent to [facility] in [place]. The pt was sent to [facility] but they don't accept her insurance. Pt verbally understood. Once the order is placed please let me know, the pt is requesting a call back. Please advise

## 2019-08-04 NOTE — Telephone Encounter (Signed)
Called pt and told her that the order in epic will be good for [facility]. The pt thanked me for sending a message.

## 2019-08-04 NOTE — Telephone Encounter (Signed)
[facility] should accept same order in Epic, let me know if they need a different one. Thanks, JML

## 2019-09-02 ENCOUNTER — Telehealth: Payer: Self-pay | Admitting: Certified Nurse Midwife

## 2019-09-02 NOTE — Telephone Encounter (Signed)
Pt called in and stated that she had a mammogram done and the pt is wanting to go over her results. The pt is requesting a call back. Please advise

## 2019-09-04 ENCOUNTER — Telehealth: Payer: Self-pay

## 2019-09-04 NOTE — Telephone Encounter (Signed)
Please advise patient that mammogram results are not yet available in chart for CNM review. Thanks, JML

## 2019-09-04 NOTE — Telephone Encounter (Signed)
Patient informed of Serafina Royals CNMs message

## 2019-12-26 ENCOUNTER — Other Ambulatory Visit: Payer: Self-pay | Admitting: Nurse Practitioner

## 2019-12-26 DIAGNOSIS — J302 Other seasonal allergic rhinitis: Secondary | ICD-10-CM

## 2019-12-26 NOTE — Telephone Encounter (Signed)
Requested medication (s) are due for refill today:  Yes  Requested medication (s) are on the active medication list:  Yes  Future visit scheduled:  No  Last Refill: 11/2018; #90; RF x 3  Notes to clinic:  Pt. Has not been seen in office x 2 years. (last appt 09/2017)  Phone call to pt.  Left vm to call office to schedule appt. with Dr. Kirtland Bouchard.  Please advise.   Requested Prescriptions  Pending Prescriptions Disp Refills   cetirizine (ZYRTEC) 10 MG tablet [Pharmacy Med Name: CETIRIZINE HCL 10 MG TABLET] 90 tablet 3    Sig: TAKE 1 TABLET BY MOUTH EVERY DAY      Ear, Nose, and Throat:  Antihistamines Failed - 12/26/2019  1:41 AM      Failed - Valid encounter within last 12 months    Recent Outpatient Visits           2 years ago Contusion of face, initial encounter   [facility] [place], Netta Neat, DO   2 years ago Mild intermittent asthma without complication   [facility] Smitty Cords, DO       Future Appointments             In 4 months [place], Vanessa [place], CNM [facility]

## 2020-05-13 ENCOUNTER — Encounter: Payer: BLUE CROSS/BLUE SHIELD | Admitting: Certified Nurse Midwife

## 2020-05-13 DIAGNOSIS — Z114 Encounter for screening for human immunodeficiency virus [HIV]: Secondary | ICD-10-CM

## 2020-05-13 DIAGNOSIS — Z1159 Encounter for screening for other viral diseases: Secondary | ICD-10-CM

## 2021-09-01 ENCOUNTER — Other Ambulatory Visit: Payer: Self-pay

## 2021-09-01 DIAGNOSIS — Z1231 Encounter for screening mammogram for malignant neoplasm of breast: Secondary | ICD-10-CM

## 2021-09-06 ENCOUNTER — Encounter: Payer: Self-pay | Admitting: *Deleted

## 2021-09-06 ENCOUNTER — Other Ambulatory Visit: Payer: Self-pay

## 2021-09-06 ENCOUNTER — Ambulatory Visit: Payer: Self-pay | Attending: Hematology and Oncology | Admitting: *Deleted

## 2021-09-06 VITALS — BP 142/74 | Wt 144.0 lb

## 2021-09-06 DIAGNOSIS — Z1211 Encounter for screening for malignant neoplasm of colon: Secondary | ICD-10-CM

## 2021-09-06 DIAGNOSIS — N6320 Unspecified lump in the left breast, unspecified quadrant: Secondary | ICD-10-CM

## 2021-09-06 NOTE — Progress Notes (Signed)
[name] is a 56 y.o. female who presents to [facility] clinic today with complaints of a left nipple painful mass.  Patient is currently being treated with a cream by her PCP.  Patient states her nipple is typically inverted, but "came out" with the painful "purple mass".  States on minimal relief with the cream that has been prescribed.   Patient presents for clinical breast exam and mammogram.    Pap Smear: Pap not smear completed today. Last Pap smear was on 04/30/18 at [facility] clinic and was  negative / negative . Per patient has no history of an abnormal Pap smear. Last Pap smear result is available in Epic.   Physical exam: Breasts Physical Exam Chest:  Breasts:    Right: No swelling, bleeding, inverted nipple, mass, nipple discharge, skin change or tenderness.     Left: No swelling, bleeding, inverted nipple, mass, nipple discharge, skin change or tenderness.    Lymphadenopathy:     Upper Body:     Right upper body: No supraclavicular or axillary adenopathy.     Left upper body: No supraclavicular or axillary adenopathy.    Pelvic/Bimanual Pap is not indicated today    Smoking History: Patient has never smoked     Patient Navigation: Patient education provided. Taught self breast awareness.  Access to services provided for patient through BCCCP program. [place], the [facility] interpreter provided interpretation. No transportation provided   Colorectal Cancer Screening: Per patient has had colonoscopy completed on 2019.  No complaints today. FIT test given with instructions.   Breast and Cervical Cancer Risk Assessment: Patient does not have family history of breast cancer, known genetic mutations, or radiation treatment to the chest before age 16. Patient does not have history of cervical dysplasia, immunocompromised, or DES exposure in-utero.  Risk Assessment   No risk assessment data    Risk Assessment     Risk Scores       09/06/2021   Last edited by:  Narda Rutherford, LPN   5-year risk: 0.9 %   Lifetime risk: 6.4 %             A: BCCCP exam without pap smear   P: Referred patient to the [facility] for a diagnostic mammogram. Appointment scheduled to be scheduled by Joellyn Quails.  Patient's husband came in and I reviewed the plan of care.  He is agreeable to the plan.  He had concerns about "injections", explained we were going to do additional imaging and would go with the radiologist and PCP's recommendation if any additional procedures would be necessary. Jim Like, RN 09/06/2021 11:05 AM

## 2021-09-06 NOTE — Progress Notes (Deleted)
  Subjective:     Patient ID: Monique Sanders, female   DOB: 02/27/1965, 56 y.o.   MRN: [id number]  HPI   Review of Systems     Objective:   Physical Exam Chest:  Breasts:    Right: No swelling, bleeding, inverted nipple, mass, nipple discharge, skin change or tenderness.     Left: No swelling, bleeding, inverted nipple, mass, nipple discharge, skin change or tenderness.    Lymphadenopathy:     Upper Body:     Right upper body: No supraclavicular or axillary adenopathy.     Left upper body: No supraclavicular or axillary adenopathy.        Assessment:     ***    Plan:     ***

## 2021-09-09 ENCOUNTER — Other Ambulatory Visit: Payer: Self-pay | Admitting: Neurology

## 2021-09-10 LAB — FECAL OCCULT BLOOD, IMMUNOCHEMICAL: Fecal Occult Bld: NEGATIVE

## 2021-09-12 ENCOUNTER — Telehealth: Payer: Self-pay

## 2021-09-12 NOTE — Telephone Encounter (Signed)
Via Julie Sowell, Spanish Interpreter ([place]) Patient informed negative FIT test results. Patient verbalized understanding.  ?

## 2021-09-27 ENCOUNTER — Ambulatory Visit
Admission: RE | Admit: 2021-09-27 | Discharge: 2021-09-27 | Disposition: A | Discharge status: Home / Self Care | Payer: Self-pay | Source: Ambulatory Visit | Attending: Obstetrics and Gynecology | Admitting: Obstetrics and Gynecology

## 2021-09-27 DIAGNOSIS — N6320 Unspecified lump in the left breast, unspecified quadrant: Secondary | ICD-10-CM

## 2021-12-05 ENCOUNTER — Ambulatory Visit: Payer: Self-pay | Attending: Hematology and Oncology | Admitting: *Deleted

## 2021-12-05 ENCOUNTER — Ambulatory Visit: Payer: Self-pay | Attending: Hematology and Oncology

## 2021-12-05 VITALS — BP 155/90 | Wt 143.2 lb

## 2021-12-05 DIAGNOSIS — Z1239 Encounter for other screening for malignant neoplasm of breast: Secondary | ICD-10-CM

## 2021-12-05 NOTE — Progress Notes (Signed)
[name] is a 56 y.o. female who presents to [facility] clinic today with complaint of left nipple mass since July 2023. Patient was seen in [facility] 09/06/2021. Patient had a diagnostic mammogram and left breast ultrasound completed 09/27/2021 that was negative. Patient was referred to a Dermatologist by her PCP and had a skin biopsy completed that showed a stain for estrogen receptor is focally positive with a possibility of a adenoma, low-grade adenosquamous carcinoma, or cutaneous adnexal neoplasm that a surgical consult is recommended. Patient stated she has had some mild improvement with antibiotics and topical treatment. Complaints of left nipple pain that comes and goes. Patient rates the pain at a 8 out of 10.   Pap Smear: Pap smear not completed today. Last Pap smear was 04/30/2018 at [facility] and was normal with negative HPV. Per patient has no history of an abnormal Pap smear. Last Pap smear result is available in Epic.   Physical exam: Breasts Breasts symmetrical. No skin abnormalities right breast. Left nipple swollen with a reddened area in the center and a purplish color around the reddened. No nipple retraction bilateral breasts. No nipple discharge bilateral breasts. No lymphadenopathy. No lumps palpated left breast. Palpated a lump within the right breast at 11 o'clock 12 cm from the nipple that per patient has been worked up in 2006 and patient denied any changes. Complaints of pain when palpated left nipple on exam.  MS DIGITAL DIAG TOMO BILAT  Result Date: 09/27/2021 CLINICAL DATA:  A 56 year old female who presents with left nipple pain and purple discoloration. She is also due for bilateral screening. EXAM: DIGITAL DIAGNOSTIC BILATERAL MAMMOGRAM WITH TOMOSYNTHESIS; ULTRASOUND LEFT BREAST LIMITED TECHNIQUE: Bilateral digital diagnostic mammography and breast tomosynthesis was performed.; Targeted ultrasound examination of the left breast was performed. COMPARISON:  Previous  exam(s). ACR Breast Density Category c: The breast tissue is heterogeneously dense, which may obscure small masses. FINDINGS: Spot tomosynthesis views of the left retroareolar region do not demonstrate any suspicious mammographic findings. No suspicious masses, calcifications, or other findings in bilateral breasts. On physical exam, there is mild swelling with focal ulceration of the left nipple. No palpable mass or breast erythema. Targeted left breast ultrasound demonstrates normal fibroglandular tissue in the retroareolar region. There is no suspicious solid or cystic mass, area of shadowing, or fluid collection. IMPRESSION: 1. No mammographic or sonographic etiology for left nipple pain. Specifically, no fluid collection identified. 2. No mammographic evidence of malignancy in bilateral breasts. RECOMMENDATION: Any further workup of the patient's symptoms should be based on the clinical assessment. Recommend routine annual screening mammogram in 1 year. I have discussed the findings and recommendations with the patient. If applicable, a reminder letter will be sent to the patient regarding the next appointment. BI-RADS CATEGORY  1: Negative. Electronically Signed   By: Beryle Flock M.D.   On: 09/27/2021 12:39  MM 3D SCREEN BREAST BILATERAL  Result Date: 08/02/2018 CLINICAL DATA:  Screening. EXAM: DIGITAL SCREENING BILATERAL MAMMOGRAM WITH TOMO AND CAD COMPARISON:  Previous exam(s). ACR Breast Density Category c: The breast tissue is heterogeneously dense, which may obscure small masses. FINDINGS: There are no findings suspicious for malignancy. Images were processed with CAD. IMPRESSION: No mammographic evidence of malignancy. A result letter of this screening mammogram will be mailed directly to the patient. RECOMMENDATION: Screening mammogram in one year. (Code:SM-B-01Y) BI-RADS CATEGORY  1: Negative. Electronically Signed   By: Lillia Mountain M.D.   On: 08/02/2018 13:01   MM 3D  SCREEN BREAST  BILATERAL  Result Date: 07/05/2017 CLINICAL DATA:  Screening. EXAM: DIGITAL SCREENING BILATERAL MAMMOGRAM WITH TOMO AND CAD COMPARISON:  Previous exam(s). ACR Breast Density Category c: The breast tissue is heterogeneously dense, which may obscure small masses. FINDINGS: There are no findings suspicious for malignancy. Images were processed with CAD. IMPRESSION: No mammographic evidence of malignancy. A result letter of this screening mammogram will be mailed directly to the patient. RECOMMENDATION: Screening mammogram in one year. (Code:SM-B-01Y) BI-RADS CATEGORY  1: Negative. Electronically Signed   By: Ted Mcalpine M.D.   On: 07/05/2017 17:20     Pelvic/Bimanual Pap is not indicated today per BCCCP guidelines.   Smoking History: Patient has never smoked.   Patient Navigation: Patient education provided. Access to services provided for patient through [facility] program.   Colorectal Cancer Screening: Per patient has had colonoscopy completed on 08/30/2017 at [facility]. FIT Test completed 09/06/2021 that was negative.  No complaints today.    Breast and Cervical Cancer Risk Assessment: Patient does not have family history of breast cancer, known genetic mutations, or radiation treatment to the chest before age 18. Patient does not have history of cervical dysplasia, immunocompromised, or DES exposure in-utero.  Risk Assessment     Risk Scores       12/05/2021 09/06/2021   Last edited by: Priscille Heidelberg, RN McGill, Fidel Levy, LPN   5-year risk: 0.9 % 0.9 %   Lifetime risk: 6.4 % 6.4 %           A: BCCCP exam without pap smear Complaint of left nipple skin changes, swelling, and pain.  P: Referred patient [facility] Surgery for Surgical Consult per recommendation. Will call patient when receive appointment.  Priscille Heidelberg, RN 12/05/2021 12:18 PM

## 2021-12-05 NOTE — Patient Instructions (Signed)
Explained breast self awareness with Monique Sanders. Patient did not need a Pap smear today due to last Pap smear and HPV typing was 04/30/2018. Let her know BCCCP will cover Pap smears and HPV typing every 5 years unless has a history of abnormal Pap smears. Referred patient [facility] for Surgical Consult per recommendation. Will call patient when receive appointment. Monique Sanders verbalized understanding.  Monique Sanders, Arvil Chaco, RN 12:19 PM

## 2021-12-09 ENCOUNTER — Ambulatory Visit: Payer: Self-pay | Admitting: Surgery

## 2021-12-15 ENCOUNTER — Encounter: Payer: Self-pay | Admitting: Surgery

## 2021-12-15 ENCOUNTER — Encounter
Admission: RE | Admit: 2021-12-15 | Discharge: 2021-12-15 | Disposition: A | Discharge status: Home / Self Care | Payer: Self-pay | Source: Ambulatory Visit | Attending: Surgery | Admitting: Surgery

## 2021-12-15 VITALS — Ht 62.0 in | Wt 147.0 lb

## 2021-12-15 DIAGNOSIS — D649 Anemia, unspecified: Secondary | ICD-10-CM

## 2021-12-15 DIAGNOSIS — Z01818 Encounter for other preprocedural examination: Secondary | ICD-10-CM

## 2021-12-15 DIAGNOSIS — Z01812 Encounter for preprocedural laboratory examination: Secondary | ICD-10-CM

## 2021-12-15 HISTORY — DX: Anemia, unspecified: D64.9

## 2021-12-15 NOTE — Patient Instructions (Addendum)
Your procedure is scheduled on:12-23-21 Friday Report to the Registration Desk on the 1st floor of the Medical Mall.Then proceed to the 2nd floor Surgery Desk To find out your arrival time, please call [phone] between 1PM - 3PM on:12-22-21 Thursday If your arrival time is 6:00 am, do not arrive prior to that time as the Medical Mall entrance doors do not open until 6:00 am.  REMEMBER: Instructions that are not followed completely may result in serious medical risk, up to and including death; or upon the discretion of your surgeon and anesthesiologist your surgery may need to be rescheduled.  Do not eat food after midnight the night before surgery.  No gum chewing, lozengers or hard candies.  You may however, drink CLEAR liquids up to 2 hours before you are scheduled to arrive for your surgery. Do not drink anything within 2 hours of your scheduled arrival time.  Clear liquids include: - water  - apple juice without pulp - gatorade (not RED colors) - black coffee or tea (Do NOT add milk or creamers to the coffee or tea) Do NOT drink anything that is not on this list.  TAKE THESE MEDICATIONS THE MORNING OF SURGERY WITH A SIP OF WATER: -cetirizine (ZYRTEC)   Use your Albuterol Inhaler the day of surgery and bring your Albuterol Inhaler to the hospital  One week prior to surgery: Stop Anti-inflammatories (NSAIDS) such as Advil, Aleve, Ibuprofen, Motrin, Naproxen, Naprosyn and Aspirin based products such as Excedrin, Goodys Powder, BC Powder.You may however, continue to take Tylenol if needed for pain up until the day of surgery.  Stop ANY OVER THE COUNTER supplements/vitamins NOW (12-16-21)until after surgery (Vitamin B12, Multi Vitamin and Vitamin D3)  No Alcohol for 24 hours before or after surgery.  No Smoking including e-cigarettes for 24 hours prior to surgery.  No chewable tobacco products for at least 6 hours prior to surgery.  No nicotine patches on the day of  surgery.  Do not use any "recreational" drugs for at least a week prior to your surgery.  Please be advised that the combination of cocaine and anesthesia may have negative outcomes, up to and including death. If you test positive for cocaine, your surgery will be cancelled.  On the morning of surgery brush your teeth with toothpaste and water, you may rinse your mouth with mouthwash if you wish. Do not swallow any toothpaste or mouthwash.  Use CHG Soap as directed on instruction sheet.  Do not wear jewelry, make-up, hairpins, clips or nail polish.  Do not wear lotions, powders, or perfumes.   Do not shave body from the neck down 48 hours prior to surgery just in case you cut yourself which could leave a site for infection.  Also, freshly shaved skin may become irritated if using the CHG soap.  Contact lenses, hearing aids and dentures may not be worn into surgery.  Do not bring valuables to the hospital. [facility] is not responsible for any missing/lost belongings or valuables.   Notify your doctor if there is any change in your medical condition (cold, fever, infection).  Wear comfortable clothing (specific to your surgery type) to the hospital.  After surgery, you can help prevent lung complications by doing breathing exercises.  Take deep breaths and cough every 1-2 hours. Your doctor may order a device called an Incentive Spirometer to help you take deep breaths. When coughing or sneezing, hold a pillow firmly against your incision with both hands. This is called "splinting." Doing  this helps protect your incision. It also decreases belly discomfort.  If you are being admitted to the hospital overnight, leave your suitcase in the car. After surgery it may be brought to your room.  If you are being discharged the day of surgery, you will not be allowed to drive home. You will need a responsible adult (18 years or older) to drive you home and stay with you that night.   If  you are taking public transportation, you will need to have a responsible adult (18 years or older) with you. Please confirm with your physician that it is acceptable to use public transportation.   Please call the [facility] at [phone] if you have any questions about these instructions.  Surgery Visitation Policy:  Patients undergoing a surgery or procedure may have two family members or support persons with them as long as the person is not COVID-19 positive or experiencing its symptoms.     Su procedimiento est programado para el: viernes 12-23-21 Presntese en el mostrador de Press photographer piso del centro comercial mdico. Luego dirjase al mostrador de ciruga del segundo piso. Para saber su hora de llegada, llame al [phone] entre la 1:00 p. m. y las 3:00 p. m. Jac [place]avan 12-22-21. Si su hora de [place] 6:00 a. m., no llegue antes de esa hora ya que las puertas de [place] del [employer] no se abren [employer] 6:00 a. m.  RECORDAR: Las instrucciones que no se siguen completamente pueden provocar riesgos mdicos graves, que pueden llegar hasta la [place]; o, segn el criterio de su cirujano y Environmental health practitioner, es posible que sea Firefighter su Leisure centre manager.  No ingiera alimentos despus de la medianoche del da anterior a la ciruga. No mascar chicle, pastillas ni caramelos duros.  Sin embargo, puede beber lquidos [employer] 2 horas antes de la fecha prevista de llegada a la [place]. No beba nada dentro de las 2 horas anteriores a su hora de Psychologist, sport and exercise.  Los lquidos claros incluyen: - agua - jugo de manzana sin pulpa - gatorade (no colores ROJOS) - caf o t negro (NO agregue leche ni cremas al caf o t) NO beba nada que no est en esta lista.  TOMA ESTOS MEDICAMENTOS LA MAANA DE LA CIRUGA CON UN sorbo de agua: -cetirizina (ZYRTEC)  Utilice su inhalador de albuterol el da de la ciruga y [employer] al hospital.  Ardelia Mems  semana antes de la ciruga: Deje de tomar antiinflamatorios (AINE) como Advil, Aleve, Ibuprofeno, Motrin, Naproxen, Naprosyn y productos a base de aspirina como Excedrin, Goodys Powder, [employer]. Sin embargo, puede Clinical biochemist tomando Tylenol si es necesario para Audiological scientist siguiente. de [place].  Deje de CUALQUIER suplemento/vitamina de venta libre AHORA (04/23/21) hasta despus de la ciruga (vitamina B12, multivitamina y vitamina D3).  No consumir alcohol durante 24 horas antes o despus de la [place].  No fumar, incluidos los cigarrillos electrnicos, durante las 24 horas previas a la [place]. No consumir productos de tabaco masticables durante al menos 6 horas antes de la [place]. Sin parches de Special educational needs teacher de la [place].  No utilice ninguna droga "recreativa" durante al menos una semana antes de la [place]. Tenga en cuenta que la combinacin de cocana y anestesia puede [employer], que pueden llegar hasta la [place]. Si su prueba de cocana da positivo, su ciruga ser cancelada.  La maana de la ciruga cepille sus dientes con pasta dental y  agua, puede enjuagarse la boca con enjuague bucal si lo desea. No ingiera pasta de dientes ni enjuague bucal.  Utilice el jabn CHG como se indica en la hoja de instrucciones.  No use joyas, maquillaje, horquillas, clips ni esmalte de uas.  No use lociones, polvos ni perfumes.  No se afeite el cuerpo desde el cuello hacia abajo 48 horas antes de la ciruga en caso de que se corte, lo que podra dejar un sitio de infeccin. Adems, la piel recin afeitada puede irritarse si se utiliza el jabn CHG.  No se pueden usar lentes de contacto, audfonos ni dentaduras postizas durante la [place].  No lleve objetos de valor al hospital. [facility] no es responsable de ninguna pertenencia u objeto de valor perdido o perdido.  Notifique a su mdico si hay algn cambio en su condicin mdica (resfriado, fiebre,  infeccin).  Lleve ropa cmoda (especfica para su tipo de [place]) al hospital.  Despus de la ciruga, usted puede ayudar a prevenir complicaciones pulmonares haciendo ejercicios de respiracin. Respire profundamente y tosa cada 1 o 2 horas. Su mdico puede indicarle un dispositivo llamado espirmetro incentivador para ayudarle a respirar profundamente. Al toser o Engineering geologist, sostenga firmemente una almohada contra la incisin con ambas manos. Esto se llama "ferulizacin". Hacer esto ayuda a proteger su incisin. Tambin disminuye las molestias abdominales.  Si vas a pasar la noche en el hospital, deja tu maleta en el coche. Despus de la [place], es posible que lo lleven a su habitacin.  Si le dan el alta el da de la [place], no se le permitir conducir a casa. Necesitar que un adulto responsable (de 18 aos o ms) lo lleve a casa y se quede con usted esa noche.  Si viaja en transporte pblico, deber ir acompaado de un adulto responsable (de 18 aos o ms). Confirme con su mdico que es aceptable utilizar el transporte pblico.  Llame al Departamento de pruebas previas a la admisin al [phone] si tiene alguna pregunta sobre estas instrucciones.  Poltica de visitas a ciruga:  [employer] se someten a [place] o procedimiento pueden [employer] familiares o personas de apoyo con ellos, siempre y cuando la persona no sea positiva para COVID-19 ni experimente sus sntomas. Look up details Send feedback Side panels

## 2021-12-16 ENCOUNTER — Encounter
Admission: RE | Admit: 2021-12-16 | Discharge: 2021-12-16 | Disposition: A | Discharge status: Home / Self Care | Payer: Self-pay | Source: Ambulatory Visit | Attending: Surgery | Admitting: Surgery

## 2021-12-16 DIAGNOSIS — Z01812 Encounter for preprocedural laboratory examination: Secondary | ICD-10-CM | POA: Insufficient documentation

## 2021-12-16 DIAGNOSIS — D649 Anemia, unspecified: Secondary | ICD-10-CM | POA: Insufficient documentation

## 2021-12-16 LAB — CBC
HCT: 36.8 % (ref 36.0–46.0)
Hemoglobin: 12.4 g/dL (ref 12.0–15.0)
MCH: 30.8 pg (ref 26.0–34.0)
MCHC: 33.7 g/dL (ref 30.0–36.0)
MCV: 91.3 fL (ref 80.0–100.0)
Platelets: 239 10*3/uL (ref 150–400)
RBC: 4.03 MIL/uL (ref 3.87–5.11)
RDW: 13.5 % (ref 11.5–15.5)
WBC: 6 10*3/uL (ref 4.0–10.5)
nRBC: 0 % (ref 0.0–0.2)

## 2021-12-22 MED ORDER — FAMOTIDINE 20 MG PO TABS: 20.0000 mg | ORAL_TABLET | Freq: Once | ORAL | Status: AC

## 2021-12-22 MED ORDER — LACTATED RINGERS IV SOLN: INTRAVENOUS | Status: DC

## 2021-12-22 MED ORDER — GABAPENTIN 300 MG PO CAPS: 300.0000 mg | ORAL_CAPSULE | ORAL | Status: AC

## 2021-12-22 MED ORDER — CHLORHEXIDINE GLUCONATE CLOTH 2 % EX PADS
6.0000 | MEDICATED_PAD | Freq: Once | CUTANEOUS | Status: AC
  Administered 2021-12-23: 6 via TOPICAL

## 2021-12-22 MED ORDER — CEFAZOLIN SODIUM-DEXTROSE 2-4 GM/100ML-% IV SOLN
2.0000 g | INTRAVENOUS | Status: AC
  Administered 2021-12-23: 2 g via INTRAVENOUS

## 2021-12-22 MED ORDER — CELECOXIB 200 MG PO CAPS: 200.0000 mg | ORAL_CAPSULE | ORAL | Status: AC

## 2021-12-22 MED ORDER — CHLORHEXIDINE GLUCONATE 0.12 % MT SOLN: 15.0000 mL | Freq: Once | OROMUCOSAL | Status: AC

## 2021-12-23 ENCOUNTER — Ambulatory Visit
Admission: RE | Admit: 2021-12-23 | Discharge: 2021-12-23 | Disposition: A | Discharge status: Home / Self Care | Payer: Self-pay | Source: Ambulatory Visit | Attending: Surgery | Admitting: Surgery

## 2021-12-23 ENCOUNTER — Encounter: Payer: Self-pay | Admitting: Surgery

## 2021-12-23 ENCOUNTER — Other Ambulatory Visit: Payer: Self-pay

## 2021-12-23 ENCOUNTER — Ambulatory Visit: Payer: Self-pay | Admitting: Certified Registered"

## 2021-12-23 ENCOUNTER — Ambulatory Visit: Payer: Self-pay | Admitting: Urgent Care

## 2021-12-23 ENCOUNTER — Encounter
Admission: RE | Disposition: A | Discharge status: Home / Self Care | Payer: Self-pay | Source: Ambulatory Visit | Attending: Surgery

## 2021-12-23 DIAGNOSIS — K219 Gastro-esophageal reflux disease without esophagitis: Secondary | ICD-10-CM | POA: Insufficient documentation

## 2021-12-23 DIAGNOSIS — N649 Disorder of breast, unspecified: Secondary | ICD-10-CM

## 2021-12-23 DIAGNOSIS — Z01818 Encounter for other preprocedural examination: Secondary | ICD-10-CM

## 2021-12-23 DIAGNOSIS — N6489 Other specified disorders of breast: Secondary | ICD-10-CM | POA: Insufficient documentation

## 2021-12-23 DIAGNOSIS — J45909 Unspecified asthma, uncomplicated: Secondary | ICD-10-CM | POA: Insufficient documentation

## 2021-12-23 HISTORY — DX: Other complications of anesthesia, initial encounter: T88.59XA

## 2021-12-23 HISTORY — DX: Other specified postprocedural states: Z98.890

## 2021-12-23 HISTORY — PX: BREAST BIOPSY: SHX20

## 2021-12-23 LAB — POCT PREGNANCY, URINE: Preg Test, Ur: NEGATIVE

## 2021-12-23 SURGERY — BREAST BIOPSY
Anesthesia: General | Laterality: Left

## 2021-12-23 MED ORDER — FENTANYL CITRATE (PF) 100 MCG/2ML IJ SOLN: 25.0000 ug | INTRAMUSCULAR | Status: DC | PRN

## 2021-12-23 MED ORDER — FENTANYL CITRATE (PF) 100 MCG/2ML IJ SOLN
INTRAMUSCULAR | Status: DC | PRN
  Administered 2021-12-23: 25 ug via INTRAVENOUS

## 2021-12-23 MED ORDER — FENTANYL CITRATE (PF) 100 MCG/2ML IJ SOLN
INTRAMUSCULAR | Status: AC
  Filled 2021-12-23: qty 2

## 2021-12-23 MED ORDER — STERILE WATER FOR IRRIGATION IR SOLN
Status: DC | PRN
  Administered 2021-12-23: 500 mL

## 2021-12-23 MED ORDER — LIDOCAINE HCL (CARDIAC) PF 100 MG/5ML IV SOSY
PREFILLED_SYRINGE | INTRAVENOUS | Status: DC | PRN
  Administered 2021-12-23: 100 mg via INTRAVENOUS

## 2021-12-23 MED ORDER — LIDOCAINE HCL (PF) 1 % IJ SOLN
INTRAMUSCULAR | Status: AC
  Filled 2021-12-23: qty 30

## 2021-12-23 MED ORDER — ONDANSETRON HCL 4 MG/2ML IJ SOLN: 4.0000 mg | Freq: Once | INTRAMUSCULAR | Status: DC | PRN

## 2021-12-23 MED ORDER — DEXAMETHASONE SODIUM PHOSPHATE 10 MG/ML IJ SOLN
INTRAMUSCULAR | Status: AC
  Filled 2021-12-23: qty 1

## 2021-12-23 MED ORDER — DEXAMETHASONE SODIUM PHOSPHATE 10 MG/ML IJ SOLN
INTRAMUSCULAR | Status: DC | PRN
  Administered 2021-12-23: 10 mg via INTRAVENOUS

## 2021-12-23 MED ORDER — TRAMADOL HCL 50 MG PO TABS: 50.0000 mg | ORAL_TABLET | Freq: Four times a day (QID) | ORAL | 0 refills | Status: AC | PRN

## 2021-12-23 MED ORDER — CEFAZOLIN SODIUM-DEXTROSE 2-4 GM/100ML-% IV SOLN
INTRAVENOUS | Status: AC
  Filled 2021-12-23: qty 100

## 2021-12-23 MED ORDER — LIDOCAINE HCL (PF) 1 % IJ SOLN
INTRAMUSCULAR | Status: DC | PRN
  Administered 2021-12-23: 10 mL

## 2021-12-23 MED ORDER — CHLORHEXIDINE GLUCONATE 0.12 % MT SOLN
OROMUCOSAL | Status: AC
  Administered 2021-12-23: 15 mL via OROMUCOSAL
  Filled 2021-12-23: qty 15

## 2021-12-23 MED ORDER — LIDOCAINE HCL (PF) 2 % IJ SOLN
INTRAMUSCULAR | Status: AC
  Filled 2021-12-23: qty 5

## 2021-12-23 MED ORDER — PHENYLEPHRINE 80 MCG/ML (10ML) SYRINGE FOR IV PUSH (FOR BLOOD PRESSURE SUPPORT)
PREFILLED_SYRINGE | INTRAVENOUS | Status: AC
  Filled 2021-12-23: qty 10

## 2021-12-23 MED ORDER — MIDAZOLAM HCL 2 MG/2ML IJ SOLN
INTRAMUSCULAR | Status: AC
  Filled 2021-12-23: qty 2

## 2021-12-23 MED ORDER — DEXMEDETOMIDINE HCL IN NACL 200 MCG/50ML IV SOLN
INTRAVENOUS | Status: DC | PRN
  Administered 2021-12-23 (×2): 4 ug via INTRAVENOUS

## 2021-12-23 MED ORDER — DOCUSATE SODIUM 100 MG PO CAPS: 100.0000 mg | ORAL_CAPSULE | Freq: Two times a day (BID) | ORAL | 0 refills | Status: AC | PRN

## 2021-12-23 MED ORDER — ONDANSETRON HCL 4 MG/2ML IJ SOLN
INTRAMUSCULAR | Status: DC | PRN
  Administered 2021-12-23: 4 mg via INTRAVENOUS

## 2021-12-23 MED ORDER — PROPOFOL 10 MG/ML IV BOLUS
INTRAVENOUS | Status: AC
  Filled 2021-12-23: qty 20

## 2021-12-23 MED ORDER — FAMOTIDINE 20 MG PO TABS
ORAL_TABLET | ORAL | Status: AC
  Administered 2021-12-23: 20 mg via ORAL
  Filled 2021-12-23: qty 1

## 2021-12-23 MED ORDER — PHENYLEPHRINE HCL (PRESSORS) 10 MG/ML IV SOLN
INTRAVENOUS | Status: DC | PRN
  Administered 2021-12-23: 80 ug via INTRAVENOUS

## 2021-12-23 MED ORDER — EPHEDRINE 5 MG/ML INJ
INTRAVENOUS | Status: AC
  Filled 2021-12-23: qty 5

## 2021-12-23 MED ORDER — EPHEDRINE SULFATE-NACL 50-0.9 MG/10ML-% IV SOSY
PREFILLED_SYRINGE | INTRAVENOUS | Status: DC | PRN
  Administered 2021-12-23: 5 mg via INTRAVENOUS

## 2021-12-23 MED ORDER — CELECOXIB 200 MG PO CAPS
ORAL_CAPSULE | ORAL | Status: AC
  Administered 2021-12-23: 200 mg via ORAL
  Filled 2021-12-23: qty 1

## 2021-12-23 MED ORDER — PROPOFOL 500 MG/50ML IV EMUL
INTRAVENOUS | Status: DC | PRN
  Administered 2021-12-23: 155 ug/kg/min via INTRAVENOUS
  Administered 2021-12-23: 30 mg via INTRAVENOUS

## 2021-12-23 MED ORDER — OXYCODONE HCL 5 MG PO TABS: 5.0000 mg | ORAL_TABLET | Freq: Three times a day (TID) | ORAL | 0 refills | Status: DC | PRN

## 2021-12-23 MED ORDER — LIDOCAINE-PRILOCAINE 2.5-2.5 % EX CREA: 1.0000 | TOPICAL_CREAM | CUTANEOUS | 0 refills | Status: AC | PRN

## 2021-12-23 MED ORDER — BACITRACIN 500 UNIT/GM EX OINT
TOPICAL_OINTMENT | CUTANEOUS | Status: DC | PRN
  Administered 2021-12-23: 1 via TOPICAL

## 2021-12-23 MED ORDER — BUPIVACAINE HCL (PF) 0.5 % IJ SOLN
INTRAMUSCULAR | Status: AC
  Filled 2021-12-23: qty 30

## 2021-12-23 MED ORDER — ONDANSETRON HCL 4 MG/2ML IJ SOLN
INTRAMUSCULAR | Status: AC
  Filled 2021-12-23: qty 2

## 2021-12-23 MED ORDER — MIDAZOLAM HCL 2 MG/2ML IJ SOLN
INTRAMUSCULAR | Status: DC | PRN
  Administered 2021-12-23: 2 mg via INTRAVENOUS

## 2021-12-23 MED ORDER — GABAPENTIN 300 MG PO CAPS
ORAL_CAPSULE | ORAL | Status: AC
  Administered 2021-12-23: 300 mg via ORAL
  Filled 2021-12-23: qty 1

## 2021-12-23 MED ORDER — TRIPLE ANTIBIOTIC 3.5-400-5000 EX OINT
TOPICAL_OINTMENT | CUTANEOUS | Status: AC
  Filled 2021-12-23: qty 1

## 2021-12-23 SURGICAL SUPPLY — 34 items
APL PRP STRL LF DISP 70% ISPRP (MISCELLANEOUS) ×1
BLADE SURG 15 STRL LF DISP TIS (BLADE) ×1 IMPLANT
BLADE SURG 15 STRL SS (BLADE) ×1
CHLORAPREP W/TINT 26 (MISCELLANEOUS) ×1 IMPLANT
DEVICE DISSECT PLASMABLAD 3.0S (MISCELLANEOUS) IMPLANT
DRAPE LAPAROTOMY 77X122 PED (DRAPES) ×1 IMPLANT
DRSG TEGADERM 2-3/8X2-3/4 SM (GAUZE/BANDAGES/DRESSINGS) IMPLANT
DRSG TELFA 3X4 N-ADH STERILE (GAUZE/BANDAGES/DRESSINGS) IMPLANT
ELECT CAUTERY BLADE TIP 2.5 (TIP) ×1
ELECT REM PT RETURN 9FT ADLT (ELECTROSURGICAL) ×1
ELECTRODE CAUTERY BLDE TIP 2.5 (TIP) ×1 IMPLANT
ELECTRODE REM PT RTRN 9FT ADLT (ELECTROSURGICAL) ×1 IMPLANT
GAUZE 4X4 16PLY ~~LOC~~+RFID DBL (SPONGE) ×1 IMPLANT
GLOVE BIOGEL PI IND STRL 7.0 (GLOVE) ×1 IMPLANT
GLOVE SURG SYN 6.5 ES PF (GLOVE) ×2 IMPLANT
GLOVE SURG SYN 6.5 PF PI (GLOVE) ×2 IMPLANT
GOWN STRL REUS W/ TWL LRG LVL3 (GOWN DISPOSABLE) ×3 IMPLANT
GOWN STRL REUS W/TWL LRG LVL3 (GOWN DISPOSABLE) ×3
KIT MARKER MARGIN INK (KITS) ×1 IMPLANT
KIT TURNOVER KIT A (KITS) ×1 IMPLANT
LABEL OR SOLS (LABEL) ×1 IMPLANT
MANIFOLD NEPTUNE II (INSTRUMENTS) ×1 IMPLANT
NEEDLE HYPO 22GX1.5 SAFETY (NEEDLE) ×2 IMPLANT
PACK BASIN MINOR ARMC (MISCELLANEOUS) ×1 IMPLANT
PLASMABLADE 3.0S (MISCELLANEOUS) ×1
SUT ETHILON 3-0 (SUTURE) IMPLANT
SUT VIC AB 3-0 SH 27 (SUTURE) ×1
SUT VIC AB 3-0 SH 27X BRD (SUTURE) IMPLANT
SYR 20ML LL LF (SYRINGE) ×1 IMPLANT
SYR BULB IRRIG 60ML STRL (SYRINGE) ×1 IMPLANT
TRAP FLUID SMOKE EVACUATOR (MISCELLANEOUS) ×1 IMPLANT
TRAP NEPTUNE SPECIMEN COLLECT (MISCELLANEOUS) ×1 IMPLANT
TUBING CONNECTING 10 (TUBING) ×1 IMPLANT
WATER STERILE IRR 500ML POUR (IV SOLUTION) ×1 IMPLANT

## 2021-12-23 NOTE — Op Note (Addendum)
Pre-Op Dx: Left nipple lesion Post-Op Dx: Same Anesthesia: MAC EBL: 10 mL Complications:  none apparent Specimen: Left nipple lesion Procedure: excisional biopsy of left nipple lesion Surgeon: Lysle Pearl  Indications for procedure: Persistent left nipple lesion with recent punch biopsy noting microscopic glandular proliferation in the deep dermis not specifically diagnostic.  Differential diagnosis includes syringomatous adenoma, low-grade adenosquamous carcinoma, or cutaneous adnexal neoplasm.  Report of punch biopsy pathology scanned into system dated October 25, 2021  Description of Procedure:  Consent obtained, time out performed.  Patient placed in supine position.  Area sterilized and draped in usual position.  Local infused to area previously marked.  Wedge-shaped incision made around visible nipple lesion, through dermis with 15blade down to risk subcutaneous layer.  Lesion grossly removed, passed off operative field and margins painted.  Wound hemostasis noted, then closed in two layer fashion with 3-0 vicryl in interrupted fashion for deep dermal layer, then interrupted 3-0 nylon to approximate the dermal layer.  Wound then dressed with Neosporin, Telfa, and Tegaderm.    Pt tolerated procedure well, and transferred to PACU in stable condition. Sponge and instrument count correct at end of procedure.

## 2021-12-23 NOTE — H&P (Addendum)
Subjective:  CC: Nipple lesion [N64.9]  HPI: Monique Sanders is a 56 y.o. female who presents for evaluation of above. First noted several months ago. Symptoms include: Pain is intermittent, sharp only with palpation. Exacerbated by touch. Alleviated by nothing. Associated with nothing, including no discharge.  Recent bx scanned into system.  Past Medical History: has a past medical history of Anxiety, Asthma without status asthmaticus, BPPV (benign paroxysmal positional vertigo), right (05/30/2017), Vertigo, Vitamin B12 deficiency (07/12/2017), and Vitamin D deficiency disease.  Past Surgical History: has a past surgical history that includes Lap b/l partial salpingectomies and b/l ovarian cystotomies (2008); Cholecystectomy; and Colonoscopy (08/30/2017).  Family History: family history includes Alcohol abuse in her father; Cancer in her cousin.  Social History: reports that she has never smoked. She has never used smokeless tobacco. She reports that she does not drink alcohol and does not use drugs.  Current Medications: has a current medication list which includes the following prescription(s): albuterol, cetirizine, cholecalciferol, cyanocobalamin (vitamin b-12), ferrous sulfate, ibuprofen, multivitamin, norethindrone-ethinyl estradiol triphasic, and triamcinolone.  Allergies: Allergies Allergen Reactions Acetaminophen Shortness Of Breath Acetaminophen-Pamabrom-Pyrilam Shortness Of Breath Codeine Shortness Of Breath Pamabrom Unknown Pyrilamine Unknown  ROS: A 15 point review of systems was performed and pertinent positives and negatives noted in HPI  Objective:   BP 130/73  Pulse 67  Ht 157.5 cm (5\' 2" )  Wt 66.7 kg (147 lb)  BMI 26.89 kg/m  Constitutional : No distress, cooperative, alert Lymphatics/Throat: Supple with no lymphadenopathy Respiratory: Clear to auscultation bilaterally Cardiovascular: Regular rate and rhythm Gastrointestinal: Soft, non-tender,  non-distended, no organomegaly. Musculoskeletal: Steady gait and movement Skin: Cool and moist Psychiatric: Normal affect, non-agitated, not confused Breast: Chaperone present for exam. Left nipple with enlarged area most prominent in 12-2o clock position, small overlying skin changes. No obvious discharge, erythema, fluctuance deeper to area or anything more involving the areola.   LABS: "Persistent left nipple lesion with recent punch biopsy noting microscopic glandular proliferation in the deep dermis not specifically diagnostic."  Differential diagnosis includes syringomatous adenoma, low-grade adenosquamous carcinoma, or cutaneous adnexal neoplasm.  Report of punch biopsy pathology scanned into system dated October 25, 2021  RADS: N/a  Assessment:   Nipple lesion [N64.9], non specific diagnosis on most recent superficial biopsy. Recommended wedge resection in hopes of decreasing pain associated with lesion as well as better pathologic evaluation.  Plan:   1. Nipple lesion [N64.9] Alternatives include continued observation. Benefits include possible symptom relief, pathologic evaluation. Discussed the risk of surgery including recurrence, chronic pain, post-op infxn, poor cosmesis, poor/delayed wound healing, nipple loss, and possible re-operation to address said risks. The risks of general anesthetic, if used, includes MI, CVA, sudden death or even reaction to anesthetic medications also discussed. Typical post-op recovery time of 3-5 days with possible activity restrictions were also discussed.  The patient verbalized understanding and all questions were answered to the patient's satisfaction.  2. Patient has elected to proceed with surgical treatment. Procedure will be scheduled. Wege biopsy, left nipple  labs/images/medications/previous chart entries reviewed personally and relevant changes/updates noted above.

## 2021-12-23 NOTE — Discharge Instructions (Addendum)
AMBULATORY SURGERY  DISCHARGE INSTRUCTIONS  Removal, Care After This sheet gives you information about how to care for yourself after your procedure. Your health care provider may also give you more specific instructions. If you have problems or questions, contact your health care provider. What can I expect after the procedure? After the procedure, it is common to have: Soreness. Bruising. Itching. Follow these instructions at home: site care Follow instructions from your health care provider about how to take care of your site. Make sure you: Wash your hands with soap and water before and after you change your bandage (dressing). If soap and water are not available, use hand sanitizer. REMOVE ORIGINAL DRESSING IN 48HRS AND SHOWER.  OK TO HAVE SOAP AND WATER CLEAN AREA.  PLACE NON-ADHERENT GAUZE OVER SUTURES AFTER APPLYING EMLA CREAM AND KEEP COVERED.  CHANGE DRESSING DAILY UNTIL FOLLOWUP IN OFFICE Leave stitches (sutures), skin glue, or adhesive strips in place. These skin closures may need to stay in place for 2 weeks or longer. If adhesive strip edges start to loosen and curl up, you may trim the loose edges. Do not remove adhesive strips completely unless your health care provider tells you to do that. If the area bleeds or bruises, apply gentle pressure for 10 minutes.  Check your site every day for signs of infection. Check for: Redness, swelling, or pain. Fluid or blood. Warmth. Pus or a bad smell.  General instructions Rest and then return to your normal activities as told by your health care provider.  Advil as needed for discomfort.    Use narcotics, if prescribed, only when advil is not enough to control pain.  Advil up to 800mg  per dose every 8hrs as needed for pain.   Keep all follow-up visits as told by your health care provider. This is important. Contact a health care provider if: You have redness, swelling, or pain around your site. You have fluid or blood coming  from your site. Your site feels warm to the touch. You have pus or a bad smell coming from your site. You have a fever. Your sutures, skin glue, or adhesive strips loosen or come off sooner than expected. Get help right away if: You have bleeding that does not stop with pressure or a dressing. Summary After the procedure, it is common to have some soreness, bruising, and itching at the site. Follow instructions from your health care provider about how to take care of your site. Check your site every day for signs of infection. Contact a health care provider if you have redness, swelling, or pain around your site, or your site feels warm to the touch. Keep all follow-up visits as told by your health care provider. This is important. This information is not intended to replace advice given to you by your health care provider. Make sure you discuss any questions you have with your health care provider. Document Released: 02/26/2015 Document Revised: 07/30/2017 Document Reviewed: 07/30/2017 Elsevier Interactive Patient Education  08/01/2017.     The drugs that you were given will stay in your system until tomorrow so for the next 24 hours you should not:  Drive an automobile Make any legal decisions Drink any alcoholic beverage  You may resume regular meals tomorrow.  Today it is better to start with liquids and gradually work up to solid foods.  You may eat anything you prefer, but it is better to start with liquids, then soup and crackers, and gradually work up to solid  foods.   Please notify your doctor immediately if you have any unusual bleeding, trouble breathing, redness and pain at the surgery site, drainage, fever, or pain not relieved by medication.    Your post-operative visit with Dr. Tonna Boehringer                                    is: Date:  01/02/2022                  Time:  2:00 pm (please arrive 15 minutes prior to appt. time)  Please call to schedule your  post-operative visit.  Additional Instructions:

## 2021-12-23 NOTE — Anesthesia Preprocedure Evaluation (Signed)
Anesthesia Evaluation  Patient identified by MRN, date of birth, ID band Patient awake    Reviewed: Allergy & Precautions, H&P , NPO status , Patient's Chart, lab work & pertinent test results, reviewed documented beta blocker date and time   History of Anesthesia Complications (+) PONV and history of anesthetic complications  Airway Mallampati: II  TM Distance: >3 FB Neck ROM: full    Dental  (+) Dental Advidsory Given, Teeth Intact   Pulmonary neg shortness of breath, asthma , neg sleep apnea, neg recent URI   Pulmonary exam normal breath sounds clear to auscultation       Cardiovascular Exercise Tolerance: Good negative cardio ROS Normal cardiovascular exam Rhythm:regular Rate:Normal     Neuro/Psych  PSYCHIATRIC DISORDERS Anxiety     negative neurological ROS     GI/Hepatic Neg liver ROS,GERD  ,,  Endo/Other  negative endocrine ROS    Renal/GU negative Renal ROS  negative genitourinary   Musculoskeletal   Abdominal   Peds  Hematology negative hematology ROS (+)   Anesthesia Other Findings Past Medical History: No date: Allergy No date: Anemia No date: Asthma No date: Complication of anesthesia     Comment:  severe vertigo after anesthesia No date: Gallstones No date: GERD (gastroesophageal reflux disease)     Comment:  occ-no meds No date: PONV (postoperative nausea and vomiting)     Comment:  nausea only   Reproductive/Obstetrics negative OB ROS                             Anesthesia Physical Anesthesia Plan  ASA: 2  Anesthesia Plan: General   Post-op Pain Management:    Induction: Intravenous  PONV Risk Score and Plan: 4 or greater and Propofol infusion and TIVA  Airway Management Planned: Natural Airway and Simple Face Mask  Additional Equipment:   Intra-op Plan:   Post-operative Plan:   Informed Consent: I have reviewed the patients History and Physical,  chart, labs and discussed the procedure including the risks, benefits and alternatives for the proposed anesthesia with the patient or authorized representative who has indicated his/her understanding and acceptance.     Dental Advisory Given  Plan Discussed with: Anesthesiologist, CRNA and Surgeon  Anesthesia Plan Comments:        Anesthesia Quick Evaluation

## 2021-12-23 NOTE — Transfer of Care (Signed)
Immediate Anesthesia Transfer of Care Note  Patient: Monique Sanders  Procedure(s) Performed: BREAST BIOPSY (Left)  Patient Location: PACU  Anesthesia Type:General  Level of Consciousness: drowsy  Airway & Oxygen Therapy: Patient Spontanous Breathing and Patient connected to face mask oxygen  Post-op Assessment: Report given to RN and Post -op Vital signs reviewed and stable  Post vital signs: Reviewed and stable  Last Vitals:  Vitals Value Taken Time  BP 155/89 12/23/21 1015  Temp 36.1 C 12/23/21 1015  Pulse 82 12/23/21 1017  Resp 22 12/23/21 1017  SpO2 100 % 12/23/21 1017  Vitals shown include unvalidated device data.  Last Pain:  Vitals:   12/23/21 1015  TempSrc:   PainSc: Asleep         Complications: No notable events documented.

## 2021-12-26 ENCOUNTER — Encounter: Payer: Self-pay | Admitting: Surgery

## 2021-12-26 LAB — SURGICAL PATHOLOGY

## 2022-01-02 NOTE — Anesthesia Postprocedure Evaluation (Signed)
Anesthesia Post Note  Patient: Monique Sanders  Procedure(s) Performed: BREAST BIOPSY (Left)  Patient location during evaluation: PACU Anesthesia Type: General Level of consciousness: awake and alert Pain management: pain level controlled Vital Signs Assessment: post-procedure vital signs reviewed and stable Respiratory status: spontaneous breathing, nonlabored ventilation, respiratory function stable and patient connected to nasal cannula oxygen Cardiovascular status: blood pressure returned to baseline and stable Postop Assessment: no apparent nausea or vomiting Anesthetic complications: no   No notable events documented.   Last Vitals:  Vitals:   12/23/21 1045 12/23/21 1102  BP: 124/69 (!) 120/57  Pulse: 72 71  Resp: 16 15  Temp: 37 C (!) 36.1 C  SpO2: 97% 100%    Last Pain:  Vitals:   12/23/21 1102  TempSrc: Temporal  PainSc: 0-No pain                 Lenard Simmer

## 2022-02-14 ENCOUNTER — Other Ambulatory Visit: Payer: Self-pay

## 2022-02-17 ENCOUNTER — Encounter: Payer: Self-pay | Admitting: Surgery
